# Patient Record
Sex: Female | Born: 1970 | Race: White | Hispanic: No | Marital: Married | Smoking: Never smoker
Health system: Southern US, Community
[De-identification: ages and names within clinical notes are randomized; demographics above are authoritative.]

## PROBLEM LIST (undated history)

## (undated) HISTORY — PX: TUBAL LIGATION: SHX77

## (undated) HISTORY — PX: HERNIA REPAIR: SHX51

---

## 1999-02-12 ENCOUNTER — Other Ambulatory Visit: Admission: RE | Admit: 1999-02-12 | Discharge: 1999-02-12 | Payer: Self-pay | Admitting: Obstetrics and Gynecology

## 1999-05-22 ENCOUNTER — Encounter (INDEPENDENT_AMBULATORY_CARE_PROVIDER_SITE_OTHER): Payer: Self-pay | Admitting: Specialist

## 1999-05-22 ENCOUNTER — Other Ambulatory Visit: Admission: RE | Admit: 1999-05-22 | Discharge: 1999-05-22 | Payer: Self-pay | Admitting: *Deleted

## 1999-09-29 ENCOUNTER — Other Ambulatory Visit: Admission: RE | Admit: 1999-09-29 | Discharge: 1999-09-29 | Payer: Self-pay | Admitting: *Deleted

## 2000-06-01 ENCOUNTER — Other Ambulatory Visit: Admission: RE | Admit: 2000-06-01 | Discharge: 2000-06-01 | Payer: Self-pay | Admitting: *Deleted

## 2001-07-13 ENCOUNTER — Other Ambulatory Visit: Admission: RE | Admit: 2001-07-13 | Discharge: 2001-07-13 | Payer: Self-pay | Admitting: Obstetrics and Gynecology

## 2001-11-02 ENCOUNTER — Ambulatory Visit (HOSPITAL_COMMUNITY): Admission: RE | Admit: 2001-11-02 | Discharge: 2001-11-02 | Payer: Self-pay | Admitting: Obstetrics and Gynecology

## 2001-11-02 ENCOUNTER — Encounter: Payer: Self-pay | Admitting: Obstetrics and Gynecology

## 2002-03-08 ENCOUNTER — Other Ambulatory Visit: Admission: RE | Admit: 2002-03-08 | Discharge: 2002-03-08 | Payer: Self-pay | Admitting: Obstetrics and Gynecology

## 2002-09-21 ENCOUNTER — Inpatient Hospital Stay (HOSPITAL_COMMUNITY): Admission: AD | Admit: 2002-09-21 | Discharge: 2002-09-26 | Payer: Self-pay | Admitting: Obstetrics and Gynecology

## 2002-09-23 ENCOUNTER — Encounter (INDEPENDENT_AMBULATORY_CARE_PROVIDER_SITE_OTHER): Payer: Self-pay

## 2002-10-20 ENCOUNTER — Other Ambulatory Visit: Admission: RE | Admit: 2002-10-20 | Discharge: 2002-10-20 | Payer: Self-pay | Admitting: Obstetrics and Gynecology

## 2003-08-14 ENCOUNTER — Ambulatory Visit (HOSPITAL_COMMUNITY): Admission: RE | Admit: 2003-08-14 | Discharge: 2003-08-14 | Payer: Self-pay | Admitting: Internal Medicine

## 2004-09-05 ENCOUNTER — Ambulatory Visit (HOSPITAL_COMMUNITY): Admission: RE | Admit: 2004-09-05 | Discharge: 2004-09-05 | Payer: Self-pay | Admitting: Obstetrics and Gynecology

## 2004-10-23 ENCOUNTER — Inpatient Hospital Stay (HOSPITAL_COMMUNITY): Admission: AD | Admit: 2004-10-23 | Discharge: 2004-10-23 | Payer: Self-pay | Admitting: Obstetrics and Gynecology

## 2004-11-23 ENCOUNTER — Inpatient Hospital Stay (HOSPITAL_COMMUNITY): Admission: RE | Admit: 2004-11-23 | Discharge: 2004-11-23 | Payer: Self-pay | Admitting: Obstetrics & Gynecology

## 2005-03-31 ENCOUNTER — Other Ambulatory Visit: Admission: RE | Admit: 2005-03-31 | Discharge: 2005-03-31 | Payer: Self-pay | Admitting: Gynecology

## 2005-04-07 ENCOUNTER — Ambulatory Visit: Payer: Self-pay | Admitting: *Deleted

## 2005-08-08 ENCOUNTER — Inpatient Hospital Stay (HOSPITAL_COMMUNITY): Admission: AD | Admit: 2005-08-08 | Discharge: 2005-08-08 | Payer: Self-pay | Admitting: Gynecology

## 2006-06-06 ENCOUNTER — Inpatient Hospital Stay (HOSPITAL_COMMUNITY): Admission: AD | Admit: 2006-06-06 | Discharge: 2006-06-06 | Payer: Self-pay | Admitting: Gynecology

## 2006-07-09 ENCOUNTER — Ambulatory Visit (HOSPITAL_COMMUNITY): Admission: RE | Admit: 2006-07-09 | Discharge: 2006-07-09 | Payer: Self-pay | Admitting: Obstetrics and Gynecology

## 2006-07-30 ENCOUNTER — Ambulatory Visit (HOSPITAL_COMMUNITY): Admission: RE | Admit: 2006-07-30 | Discharge: 2006-07-30 | Payer: Self-pay | Admitting: Obstetrics and Gynecology

## 2006-09-01 ENCOUNTER — Ambulatory Visit (HOSPITAL_COMMUNITY): Admission: RE | Admit: 2006-09-01 | Discharge: 2006-09-01 | Payer: Self-pay | Admitting: Obstetrics and Gynecology

## 2006-10-01 ENCOUNTER — Inpatient Hospital Stay (HOSPITAL_COMMUNITY): Admission: AD | Admit: 2006-10-01 | Discharge: 2006-10-01 | Payer: Self-pay | Admitting: Obstetrics and Gynecology

## 2006-10-02 ENCOUNTER — Inpatient Hospital Stay (HOSPITAL_COMMUNITY): Admission: AD | Admit: 2006-10-02 | Discharge: 2006-10-02 | Payer: Self-pay | Admitting: Obstetrics and Gynecology

## 2006-12-03 ENCOUNTER — Inpatient Hospital Stay (HOSPITAL_COMMUNITY): Admission: AD | Admit: 2006-12-03 | Discharge: 2006-12-03 | Payer: Self-pay | Admitting: Obstetrics and Gynecology

## 2007-01-13 ENCOUNTER — Inpatient Hospital Stay (HOSPITAL_COMMUNITY): Admission: RE | Admit: 2007-01-13 | Discharge: 2007-01-16 | Payer: Self-pay | Admitting: Obstetrics and Gynecology

## 2007-01-13 ENCOUNTER — Encounter (INDEPENDENT_AMBULATORY_CARE_PROVIDER_SITE_OTHER): Payer: Self-pay | Admitting: Obstetrics and Gynecology

## 2008-12-20 ENCOUNTER — Ambulatory Visit (HOSPITAL_COMMUNITY): Admission: RE | Admit: 2008-12-20 | Discharge: 2008-12-20 | Payer: Self-pay | Admitting: Obstetrics and Gynecology

## 2009-03-11 ENCOUNTER — Ambulatory Visit (HOSPITAL_COMMUNITY): Admission: RE | Admit: 2009-03-11 | Discharge: 2009-03-11 | Payer: Self-pay | Admitting: Obstetrics and Gynecology

## 2009-03-12 ENCOUNTER — Ambulatory Visit (HOSPITAL_COMMUNITY): Admission: RE | Admit: 2009-03-12 | Discharge: 2009-03-12 | Payer: Self-pay | Admitting: Obstetrics and Gynecology

## 2009-04-15 ENCOUNTER — Inpatient Hospital Stay (HOSPITAL_COMMUNITY): Admission: AD | Admit: 2009-04-15 | Discharge: 2009-04-26 | Payer: Self-pay | Admitting: Obstetrics and Gynecology

## 2009-04-23 ENCOUNTER — Encounter (INDEPENDENT_AMBULATORY_CARE_PROVIDER_SITE_OTHER): Payer: Self-pay | Admitting: Obstetrics and Gynecology

## 2010-07-06 LAB — CBC
HCT: 33.7 % — ABNORMAL LOW (ref 36.0–46.0)
Hemoglobin: 11.4 g/dL — ABNORMAL LOW (ref 12.0–15.0)
MCHC: 33.9 g/dL (ref 30.0–36.0)
MCHC: 34.6 g/dL (ref 30.0–36.0)
MCV: 96.9 fL (ref 78.0–100.0)
MCV: 98.9 fL (ref 78.0–100.0)
RBC: 3.33 MIL/uL — ABNORMAL LOW (ref 3.87–5.11)
RBC: 3.41 MIL/uL — ABNORMAL LOW (ref 3.87–5.11)
RDW: 13.7 % (ref 11.5–15.5)
RDW: 14.3 % (ref 11.5–15.5)
WBC: 14.4 10*3/uL — ABNORMAL HIGH (ref 4.0–10.5)
WBC: 19.1 10*3/uL — ABNORMAL HIGH (ref 4.0–10.5)

## 2010-07-21 LAB — CBC
HCT: 34 % — ABNORMAL LOW (ref 36.0–46.0)
Hemoglobin: 11.6 g/dL — ABNORMAL LOW (ref 12.0–15.0)
RDW: 13.1 % (ref 11.5–15.5)

## 2010-07-21 LAB — STREP B DNA PROBE: Strep Group B Ag: POSITIVE

## 2010-07-25 LAB — CBC
Hemoglobin: 12.2 g/dL (ref 12.0–15.0)
Platelets: 199 10*3/uL (ref 150–400)
RBC: 3.82 MIL/uL — ABNORMAL LOW (ref 3.87–5.11)

## 2010-09-02 NOTE — Op Note (Signed)
NAME:  [name], [name]             ACCOUNT NO.:  [id number]   MEDICAL RECORD NO.:  [id number]          PATIENT TYPE:  AMB   LOCATION:  SDC                           FACILITY:  [facility]   PHYSICIAN:  Huel Cote, M.D. DATE OF BIRTH:  11/14/1970   DATE OF PROCEDURE:  09/01/2006  DATE OF DISCHARGE:                               OPERATIVE REPORT   PREOPERATIVE DIAGNOSES:  1. Pregnancy at 19+ weeks.  2. Possible incompetent cervix.   POSTOPERATIVE DIAGNOSES:  1. Pregnancy at 19+ weeks.  2. Possible incompetent cervix.   PROCEDURE PERFORMED:  Cervical cerclage x2, McDonald style with knots at  1 o'clock.   ANESTHESIA:  Spinal.   ESTIMATED BLOOD LOSS:  100 mL.   URINE OUTPUT:  200 mL clear urine.  Fetal heart tones 140 prior to  procedure and 150 post procedure.   FINDINGS:  The cervix itself was noted to have a defect extending up as  far as the vaginal mucosa on the posterior fornix of the vagina at  approximately 1 o'clock.  It did feel that this possibly tracked up to  the internal os.  However, the internal os did not feel open.  The  cerclage itself was placed into pursestring fashions with the knots  overlying this defect.  Post procedure the defect had been minimized  although there was still a palpable small depression just at the 1  o'clock position.  However, it was much smaller and would not admit a  finger.   PROCEDURE:  The patient was taken to the operating room where spinal  anesthesia was obtained without difficulty.  She was then prepped and  draped in a normal sterile fashion in the dorsal lithotomy position.  A  weighted speculum was placed within the vagina and Sims retractor was  utilized to visualize the cervix entirely.  As had been palpated in the  office, there was a defect noted at the approximately 1 to 2 o'clock  position with the rest of the cervix appearing intact, somewhat in a C-  shaped fashion.  At this defect, you could admit a finger and  it  traveled up towards the internal os.  However, the internal os itself  could not be felt to be opened.   At this point, the [facility] suture was utilized to make a pursestring  suture which bridged this defect and with the whole circumference of the  cervix, this was tightened down and everything palpated once more.  This  defect still remained, so one additional pursestring was placed just  behind the prior one and did improve the feeling of the defect to a  smaller size.  It was impossible to completely close the defect given  that it tracked well beyond the vaginal mucosa and it was felt that the  maximum benefit had been achieved with a second cerclage.  At this  point, there was a little bit of extra bleeding at the area where the  mucosa was denuded and this was closed with a 3-0 Vicryl on an SH  needle, mostly just to minimize bleeding for patient reassurance and  there was minimal bleeding at the conclusion of the procedure.   All instruments and sponges were then removed from the vagina which was  once again swabbed with Betadine and the patient was taken to the  recovery room in stable condition.  There was no leakage of fluid noted  and the fetal heart tones were stable both prior to and after procedure.      Huel Cote, M.D.  Electronically Signed     KR/MEDQ  D:  09/01/2006  T:  09/01/2006  Job:  [id number]

## 2010-09-02 NOTE — Op Note (Signed)
NAMEVIVAN, VANDERVEER             ACCOUNT NO.:  [id number]   MEDICAL RECORD NO.:  [id number]          PATIENT TYPE:  INP   LOCATION:  9131                          [facility]ACILITY:  [facility]   PHYSICIAN:  Huel Cote, M.D. DATE OF BIRTH:  04/04/1971   DATE OF PROCEDURE:  01/13/2007  DATE OF DISCHARGE:  12/03/2006                               OPERATIVE REPORT   PREOPERATIVE DIAGNOSIS:  1. Term pregnancy at 39 weeks, delivered.  2. Previous cesarean section, declines vaginal birth after cesarean      section.  3. Incompetent cervix status post cerclage.   POSTOPERATIVE DIAGNOSIS:  1. Term pregnancy at 39 weeks, delivered.  2. Previous cesarean section, declines vaginal birth after cesarean      section.  3. Incompetent cervix status post cerclage.   PROCEDURE:  Repeat low transverse cesarean section.   SURGEON:  Huel Cote, M.D.   ASSISTANT:  Sherron Monday, M.D.   ANESTHESIA:  Spinal.   FINDINGS:  There is a viable female infant in the vertex presentation,  Apgars were 8/9, weight was 7 pounds 14 ounces.  The uterus, ovaries and  tubes were normal appearing.  There was no visible anterior defect in  the lower uterine segment. Palpation of the cervical edge through the  uterine scar did reveal that the left fornix appeared to have a defect  in the cervix, itself, with some scarring. The placenta was removed and  was felt to have a succenturiate lobe, so was sent to pathology.   ESTIMATED BLOOD LOSS:  900 mL.   URINE OUTPUT:  200 mL clear urine.   FLUIDS:  2200 mL LR.   DESCRIPTION OF PROCEDURE:  The patient was taken to the operating room  where a spinal anesthesia was obtained without difficulty.  She was then  prepped and draped in a normal sterile fashion in the dorsal supine  position with a leftward tilt.  With a Foley catheter in place, a  Pfannenstiel skin incision was then made through a pre-existing scar and  carried through to the underlying layer of fascia by  sharp dissection  and Bovie cautery.  The fascia was opened in the midline and the  incision was extended laterally with Mayo scissors.  The inferior aspect  of the incision was elevated, grasped, and dissected off the underlying  rectus muscles with Bovie cautery. In a similar fashion, the superior  aspect was dissected off the rectus muscles.  The rectus muscles were  separated in the midline and the peritoneal cavity entered bluntly.  The  peritoneal incision was extended both superiorly and inferiorly with  careful attention to avoid both bowel and bladder.  The bladder flap was  created after the Alexis large self-retaining wound retractor was placed  within the incision and the lower uterine segment was exposed nicely.   The scalpel was then utilized to make a transverse incision in the lower  uterine segment.  The cavity, itself, was entered bluntly and the  uterine incision was extended bluntly.  The fluid was noted to be clear  on rupture of membranes and the infant's head was delivered  atraumatically and bulb suctioned.  The remainder of the infant's body  delivered without difficulty.  The infant's head was delivered with a  Kiwi vacuum assist as fundal pressure was not delivering the head, so  gentle traction with the Kiwi vacuum was utilized to help deliver the  head.  After the infant was delivered, the cord was clamped and cut. The  infant was handed to awaiting pediatricians and the placenta was  delivered manually due to some brisk bleeding and handed off for cord  blood collection.   The uterus was cleared of all clots and debris with a moist lap sponge.  The uterine incision was then repaired in two layers, the first a  running locked layer of 0 chromic and the second an imbricating layer of  the same suture.  Before complete closure of the uterine incision, there  was a brief inspection of the lower uterine segment digitally and the  anterior cervical edge was just  palpable approximately 3 cm below the  incision.  It was able to be identified over until reaching the left  fornix and, at this point, there was some scar tissue but no palpable  cervix was felt.  The remainder of the uterine incision was closed and  good hemostasis was noted.  There was one small area on the midline  which was controlled with a suture of 3-0 Vicryl in a figure-of-eight  fashion.  The abdomen and pelvis were irrigated and no active bleeding  was noted.  The bladder appeared away from the uterine incision and the  urine was noted to be clear.   At this point, no active bleeding was noted subfascially, therefore the  fascia was closed with 0 Vicryl in a running fashion.  The subcutaneous  tissue was reapproximated with 2-0 plain in a subcutaneous running  stitch and the skin was closed with staples.  Sponge, lap, and needle  counts were correct x2 and the patient was taken to the recovery room in  stable condition.      Huel Cote, M.D.  Electronically Signed     KR/MEDQ  D:  01/13/2007  T:  01/13/2007  Job:  [id number]

## 2010-09-02 NOTE — H&P (Signed)
NAME:  [name], [name]             ACCOUNT NO.:  [id number]   MEDICAL RECORD NO.:  [id number]          PATIENT TYPE:  AMB   LOCATION:  SDC                           [facility]   PHYSICIAN:  Huel Cote, M.D. DATE OF BIRTH:  Oct 23, 1970   DATE OF ADMISSION:  DATE OF DISCHARGE:                              HISTORY & PHYSICAL   The patient is a 40 year old G4, P2-0-1-2, who is coming in for a  scheduled cerclage placement at approximately 14-1/2 weeks' gestation  given a finding of a prior incompetent cervix with her last pregnancy.  Her estimated due date is June 15, 2009, by an 8-week ultrasound.  She did have episodes of first trimester bleeding related to a  subchorionic hemorrhage which have resolved and otherwise has had normal  care.  She had a normal first trimester screen.  Her blood type is O+,  and all other prenatal labs were normal.   PAST OBSTETRICAL HISTORY:  Significant for a prior C-section x2.  In  2004 she had a C-section at 41 weeks of a 7 pound 8 ounce infant.  She  had a spontaneous miscarriage in 2006, and in 2008 she had a C-section  at 39 weeks of a 7 pound 14 ounce infant, the result of an IVF  pregnancy.  This pregnancy also had cerclage placement due to findings  of an incompetent cervix noted on ultrasound and physical exam.   PAST GYNECOLOGIC HISTORY:  Remote history of abnormal Pap smear and  history of infertility; however, this pregnancy was spontaneously  conceived.   PAST MEDICAL HISTORY:  None.   PAST SURGICAL HISTORY:  In 1991 she had a left inguinal hernia repair,  in 1993 repair of a ruptured ovarian cyst, in 2004 C-section and 2008  the cerclage and a repeat C-section.   She has no known drug allergies.   PHYSICAL EXAMINATION:  Weight is 160 pounds, blood pressure 100/70.  CARDIAC EXAM:  Regular rate and rhythm.  LUNGS:  Clear.  ABDOMEN:  Soft, gravid and nontender.  Fundal height is consistent with  a 14-15-week gestation.  PELVIC EXAM:  The cervix is long and closed.  However, there is a  palpable defect on the cervix in the anterior superficial surface to the  patient's left.   The patient was counseled as to the risks and benefits of cerclage  placement including bleeding, infection and possible damage to bowel and  bladder, and she understands all these risks and desires to proceed as  stated.  We will  proceed with cerclage placement and the patient knows she will likely  have to have bedrest as well, as she did last pregnancy, but we will  assess this on an ongoing basis.  She is aware of the risk of rupture of  membranes with cerclage placement and possible fetal loss should this  occur.      Huel Cote, M.D.  Electronically Signed     KR/MEDQ  D:  12/19/2008  T:  12/19/2008  Job:  [id number]

## 2010-09-02 NOTE — H&P (Signed)
NAME:  [name], [name]             ACCOUNT NO.:  [id number]   MEDICAL RECORD NO.:  [id number]          PATIENT TYPE:  INP   LOCATION:  NA                            [facility]   PHYSICIAN:  Huel Cote, M.D. DATE OF BIRTH:  16-Feb-1971   DATE OF ADMISSION:  DATE OF DISCHARGE:                              HISTORY & PHYSICAL   The patient is a 40 year old G3, P1-0-1-1 who is scheduled at [redacted] weeks  gestation for a repeat low transverse C-section and declines trial of  VBAC.  The patient had a somewhat complicated prenatal course with a  probable incompetent cervix which was treated with a cervical cerclage  placed at approximately 18-[redacted] weeks gestation.  The patient had some  cervical damage from a prior delivery and had a palpable cervical defect  which was noted early on at approximately 12 o'clock to 1 o'clock on her  physical exam.  She had the cervical cerclage placed and around [redacted] weeks  gestation was noted to be approximately a fingertip dilated.  She did  receive steroids x2 at that point and remained on bedrest the entire  pregnancy.  At approximately 32 weeks her cervix was noted to dilate  further to approximately 2 cm.  However, she was maintained on bedrest  and though she continued to dilate, reaching approximately 4-5 cm at her  last check at 38 weeks, she never went into spontaneous labor and now is  scheduled to undergo a repeat C-section.  We discussed a trial of labor.  However, due to the difficulty of her last delivery and this baby being  at the large end of normal, she declines any trial of labor at this  time.   PRENATAL LABORATORIES:  O positive, antibody negative.  RPR nonreactive.  Rubella immune.  Hepatitis B surface antigen negative.  HIV negative.  GC negative, chlamydia negative.  Group B strep positive on a urine  culture.   PAST OBSTETRICAL HISTORY:  As stated, the patient had a C-section in  2004 following an arrest of descent.  The infant was  7 pounds 8 ounces  and she had a spontaneous miscarriage in 2006.  This pregnancy was in  vitro fertilization.   PAST GYNECOLOGICAL HISTORY:  The patient has no abnormal Pap smears.  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  However, this was unsuccessful and lead to  her one miscarriage and she had in vitro fertilization to conceive this  pregnancy.   MEDICATIONS:  Prenatal vitamins and folic acid.   ALLERGIES:  None.   PAST SURGICAL HISTORY:  She had the C-section as described previously,  inguinal hernia repair, and a laparotomy with an ovarian cystectomy in  the past.   PHYSICAL EXAMINATION:  Her weight is 201 pounds, blood pressure 112/70.  CARDIAC:  Regular rate and rhythm.  LUNGS:  Clear.  ABDOMEN:  Gravid and nontender with an estimated fetal weight of 8 to 8-  and-a-half pounds.  The cervix was 70, 4-5, and a -1 station.   The patient again was counseled as to the  risks and benefits of  proceeding with another surgery versus a trial of labor.  Had she gone  into labor prior to her C-section date she was considering VBAC.  However, given that she has remained 4-5 cm dilated for several weeks  without spontaneous labor she now declines the option of VBAC and wishes  instead to proceed with a repeat C-section.  She will undergo a repeat C-  section and we discussed that her abdominal incision is slightly low,  near the pubic bone, and we may or may not be able to use this, and she  may need a new incision above that one.  She understands the risks of  bleeding, infection, and possible damage to bowel and bladder, as well  as the recovery period should any complication arise.  We will try to  evaluate whether her cervical defect extends up into the lower uterine  segment at the time of C-section, but it may not be visible at the time  of surgery.      Huel Cote, M.D.  Electronically Signed      KR/MEDQ  D:  01/12/2007  T:  01/12/2007  Job:  [id number]

## 2010-09-02 NOTE — Op Note (Signed)
NAME:  [name], [name]             ACCOUNT NO.:  [id number]   MEDICAL RECORD NO.:  [id number]          PATIENT TYPE:  AMB   LOCATION:  SDC                           [facility]   PHYSICIAN:  Huel Cote, M.D. DATE OF BIRTH:  01/04/1971   DATE OF PROCEDURE:  12/20/2008  DATE OF DISCHARGE:                               OPERATIVE REPORT   PREOPERATIVE DIAGNOSES:  1. Incompetent cervix and cervical defect from past trauma.  2. Pregnancy at approximately 15 weeks' gestation.   POSTOPERATIVE DIAGNOSES:  1. Incompetent cervix and cervical defect from past trauma.  2. Pregnancy at approximately 15 weeks' gestation.   PROCEDURE:  Cervical cerclage x2 McDonald and a repair of the cervical  defect.   SURGEON:  Huel Cote, MD   ANESTHESIA:  Spinal.   FINDINGS:  The cervix had approximately a good 1-2 cm of length noted  from the vaginal wall.  There was obvious defect from 1 to 2 o'clock on  the cervix, which had been previously documented in the patient's prior  pregnancy and seemed to track up into the lower uterine segment.  There  was no back or parts palpable at the time, so it was felt as of the  internal cervical os was closed, however; this tissue was friable and  exposed.   ESTIMATED BLOOD LOSS FOR THE PROCEDURE:  100 mL.   URINE OUTPUT:  30 mL of concentrated urine.   IV FLUIDS:  2200 of LR.   COMPLICATIONS:  No known complications.   FETAL HEART TONES:  Will be documented in the recovery room and were  documented prior to surgery.   PROCEDURE NOTE:  The patient was taken to operating room where spinal  anesthesia was obtained without difficulty.  She was then prepped and  draped in normal sterile fashion in dorsal lithotomy position.  The  bladder was drained of small amount of concentrated urine, and on exam,  the cervix was as noted with some good length available.  However, a  large defect from approximately 1 to 2 o'clock on the cervix tracking up  to at least see internal cervical os.  This area was friable and exuding  some mucus.  The cervical cerclage was then placed x2 with the McDonald  cerclage and this was carried across the gap in the cervical mucosa to  breech that area with cervical tissue and pulled together cervical  stroma.  At the conclusion of the procedure, both cerclages were placed,  one knot was at approximately 11 o'clock and the other knot at 1  o'clock.  The cervix itself did feel closed.  There was still some  visible anterior defect behind the cerclage after placement; however,  this seemed somewhat more superficial in nature, and after  consideration, it was closed with interrupted sutures of 0 Prolene to  help protect from any infection in that area from tracking up through  the exposed mucosa.  Once this was closed, there was no active bleeding  and the defect appeared closed.  The patient will be followed by vaginal  exams and serial ultrasounds as she was  last pregnancy and hopes that  this defect will not allow preterm birth.  The sponge, lap, and needle  counts were correct x2.  Again, there was no active bleeding and the  patient was taken to the recovery room in good condition.      Huel Cote, M.D.  Electronically Signed     KR/MEDQ  D:  12/20/2008  T:  12/20/2008  Job:  [id number]

## 2010-09-02 NOTE — H&P (Signed)
NAME:  [name], [name]             ACCOUNT NO.:  [id number]   MEDICAL RECORD NO.:  [id number]          PATIENT TYPE:  AMB   LOCATION:  SDC                           [facility]   PHYSICIAN:  Huel Cote, M.D. DATE OF BIRTH:  1970/12/21   DATE OF ADMISSION:  DATE OF DISCHARGE:                              HISTORY & PHYSICAL   REASON FOR ADMISSION:  Surgery is to take place at the [facility] at 8:30 a.m. on October 02, 2006.   HISTORY OF PRESENT ILLNESS:  The patient is a 40 year old G3, P1-0-1-1,  who came in for a scheduled cervical cerclage given the possibility of  incompetent cervix noted on her exam.  The patient has an interesting  history of a cervical fragment being removed after C-section which  revealed some stromal muscle, and this was somewhat infarcted at the  time of removal.  Since then on sequential cervical checks, the patient  has a normal cervical length; however, there is a defect noted in the  anterior portion of the cervix, which tracks down to approximately the  internal cervical os.  The patient is currently approximately [redacted] weeks  pregnant with a due date of January 20, 2007.  This is an in-vitro  fertilization pregnancy and other than that, the pregnancy has  progressed normally.   PRENATAL LABORATORY DATA:  O positive, antibody negative.  RPR  nonreactive.  Rubella immune.  Hepatitis B surface antigen negative.  HIV negative.  GC negative.  Chlamydia negative.  Group B strep positive  in her urine.   PAST OBSTETRICAL HISTORY:  In 2004 the patient had a C-section following  an arrest of descent.  Infant was 7 pounds 8 ounces.  She also had a  spontaneous miscarriage in 2006 and is currently pregnant at this time  with the IVF pregnancy.   The patient has a gynecological significant for abnormal Pap smear years  ago, which has since normalized, and, as stated a previous a removal of  a portion of her cervix at a postpartum visit with  some infarcted tissue  noted.   PAST SURGICAL HISTORY:  In 1991 she had a left inguinal hernia repair.  In 1993 she had a laparoscopy with repair of a left ovarian ruptured  cyst.  In 2004, her C-section.   PAST MEDICAL HISTORY:  None.   HISTORY OF ALLERGIES:  None.   CURRENT MEDICATIONS:  None but prenatal vitamins and folic acid.   PHYSICAL EXAMINATION:  VITAL SIGNS:  The patient's weight is 170 pounds.  Blood pressure 100/60.  CARDIAC:  Regular rate and rhythm.  LUNGS:  Clear.  ABDOMEN:  Gravid and nontender with a fundal height appropriate to the  umbilicus.  Positive fetal heart tones are noted.  PELVIC:  The patient has a normal cervical length; however, there is a  defect noted anteriorly, which tracks down to approximately the internal  cervical os and this feels that it could admit a small fingertip.   All options were discussed with the patient and husband, and it was made  clear that it was not obvious whether  a cervical cerclage would  completely benefit her situation or not; however, given the defect of  the cervix noted anteriorly and the possibility that the internal  cervical os was open slightly, it was felt that cervical cerclage might  could benefit her and decrease the chances she would need bed rest and  decrease her risk of preterm delivery.  We discussed all the risks and  benefits of the cerclage, including bleeding and damage to the bag of  water, which could result in loss of pregnancy, and the patient  understands these risks, however decides that she would like to proceed  with surgery.  We will proceed with surgery at the [facility]  facility on the 14th, and the patient understands all risks and benefits  and desires to proceed.      Huel Cote, M.D.  Electronically Signed     KR/MEDQ  D:  08/31/2006  T:  08/31/2006  Job:  [id number]

## 2010-09-05 NOTE — Op Note (Signed)
NAME:  [name], [name]                         ACCOUNT NO.:  [id number]   MEDICAL RECORD NO.:  [id number]                   PATIENT TYPE:  INP   LOCATION:  9303                                 FACILITY:  [facility]   PHYSICIAN:  Maxie Better, M.D.            DATE OF BIRTH:  04-22-1970   DATE OF PROCEDURE:  09/23/2002  DATE OF DISCHARGE:                                 OPERATIVE REPORT   PREOPERATIVE DIAGNOSIS:  Arrest of descent.   PROCEDURE:  Primary cesarean section.   POSTOPERATIVE DIAGNOSIS:  Arrest of descent.   ANESTHESIA:  Epidural.   SURGEON:  Maxie Better, M.D.   INDICATIONS:  This is a 40 year old gravida 1, para 0 female at term,  admitted on 09/21/02, for induction of labor secondary to question of large  for gestational age baby.  The patient underwent cervical ripening with  Cervidil, followed by Pitocin.  She subsequently had artificial rupture of  membranes at 1 cm dilatation.  The patient quickly progressed to 10 cm.  She  started pushing, however on exam was noted to have posterior presentation;  therefore, the patient was placed in an exaggerated Sims position and rested  fo ran hour.  She resumed pushing and despite different positional changes  with the pushing, the patient had no further descent beyond +1 station.  She  requested a cesarean section.  The baby had remained in the direct occiput  posterior presentation.  During her labor she developed a temperature of  100.5.  At the time the patient was fully dilated.  She, however, was  started on Unasyn for possible chorioamnionitis, although she had only had  rupture of membranes for seven hours.  Risks and benefits of the cesarean  section were reviewed with the patient and her family.  The patient had  consented for possible cesarean section.  She was transferred to the  operating room.   PROCEDURE:  Under adequate epidural anesthesia, the patient was placed in  the supine position with a left  lateral tilt.  She was thoroughly prepped  and draped in the usual fashion.  An indwelling Foley catheter had been in  place prior to transfer to the operating room.  The patient had a previous  Pfannenstiel skin incision due to ruptured ovary in the past.  Marcaine  0.25% 10 mL was injected along the previous scar.  A Pfannenstiel skin  incision was made through the previous scar and extended to the rectus  fascia using Bovie cautery.  The rectus fascia was incised in the midline  and extended bilaterally.  The rectus fascia was then bluntly and sharply  dissected off the rectus muscles in a superior and inferior fashion.  The  rectus muscle was split in the midline, the parietal peritoneum was entered  sharply and extended superiorly inferiorly.  Serous fluid was noted in the  abdomen.  The vesicouterine peritoneum was then opened and extended  transversely.  The bladder was carefully dissected off the lower uterine  segment and displaced inferiorly using a bladder retractor.  A curvilinear  low transverse uterine incision was then made and extended bilaterally using  bandage scissors.  Delivery of a live female from the occiput posterior  presentation was accomplished.  The baby was noted to be hyperextended.  The  baby was bulb-suctioned on the abdomen.  The cord was clamped, cut.  The  baby was transferred to the awaiting pediatrician, who assigned Apgars of 9  and 9.  The placenta was spontaneous intact, sent to pathology due to prior  questions during her pregnancy.  Uterine cavity was cleaned of debris.  There was no uterine anomaly noted.  Uterine incision was closed in two  layers.  The first layer was a 0 Monocryl running locked stitch, second  layer was imbricating using 0 Monocryl suture.  Small bleeders were  cauterized.  Normal tubes and ovaries were noted bilaterally.  The abdomen  was copiously irrigated and suctioned of debris.  Reinspection of the  uterine incision showed  a small area of bleeding on the left, which was  hemostased using a figure-of-eight suture.  With good hemostasis  subsequently noted, the vesicouterine peritoneum and the parietal peritoneum  were now closed.  The rectus fascia was inspected and no defects noted.  The  rectus muscle was inspected and no bleeding seen.  The rectus fascia was  closed with 0 Vicryl x2.  The skin was approximated with Ethicon staples  after the subcutaneous area was irrigated and suctioned of debris.   SPECIMENS:  Placenta was sent to pathology.   ESTIMATED BLOOD LOSS:  600 mL.   INTRAOPERATIVE FLUID:  900 crystalloid.   URINE OUTPUT:  250 mL clear yellow urine.   Sponge and instrument counts x2 were correct.   COMPLICATIONS:  None.   The patient tolerated the procedure well, was transferred to the recovery  room in stable condition.                                                Maxie Better, M.D.    [place]/MEDQ  D:  09/23/2002  T:  09/24/2002  Job:  [id number]

## 2010-09-05 NOTE — Discharge Summary (Signed)
NAME:  [name], [name]                         ACCOUNT NO.:  [id number]   MEDICAL RECORD NO.:  [id number]                   PATIENT TYPE:  INP   LOCATION:  9303                                 FACILITY:  [facility]   PHYSICIAN:  Maxie Better, M.D.            DATE OF BIRTH:  02-11-1971   DATE OF ADMISSION:  09/21/2002  DATE OF DISCHARGE:  09/26/2002                                 DISCHARGE SUMMARY   ADMISSION DIAGNOSIS:  Suspect large for gestational age baby, term  gestation.   DISCHARGE DIAGNOSES:  1. Term gestation, delivered.  2. Mild acute chorioamnionitis.  3. Arrest of descent.   PROCEDURE:  Primary cesarean section.   HISTORY OF PRESENT ILLNESS:  [age]-year-old gravida 1, para 0 female at  term, admitted for induction of labor secondary to possibly large for  gestational age baby.  Her prenatal course had been complicated by serial  ultrasounds due to an abnormal finding of hypoechoic band adhered to the  placenta.   HOSPITAL COURSE:  The patient was admitted.  She underwent cervical ripening  with Cervidil followed by Pitocin.  She subsequently had artificial rupture  of membranes at 1 cm dilatation.  The patient progressed quickly to 10 cm.  She had vertex +1 station with a suggestion of a posterior presentation.  During the course of labor, the patient developed chorioamnionitis thought  due to temperature of 100.5.  Unasyn was started.  The patient pushed,  however did not change fetal descent and therefore she was transferred to  the operating room for primary cesarean section.  The patient underwent a  primary cesarean section that resulted in delivery of a live female from the  occiput posterior presentation with the head hyperextended, Apgars of 9 and  9.  Weight is 7 pounds, 8 ounces.  Postoperatively, the patient was  continued with antibiotics until afebrile for 24 to 48 hours.  CBC on postop  day number one showed a white count of 18.9, hemoglobin 11.6,  hematocrit  33.6.  She had a maximum temperature postop of 100.7.  The patient  subsequently defervesced.  She had a bowel movement by postop day number  two.  By postoperative day number three, she was tolerating a regular diet.  Incision without any erythema, induration, or exudate.  She has moderate  lochia.  She was deemed well to be discharged home.   DISPOSITION:  Home.   CONDITION:  Stable.   DISCHARGE FOLLOWUP:  Four weeks postpartum.   DISCHARGE MEDICATIONS:  1. Motrin 600 mg, number 30, one p.o. q.6h p.r.n. pain.  2. Tylox one to two tablets every three to four hours p.r.n. pain.  3. Prenatal vitamins one p.o. q.d.    DISCHARGE INSTRUCTIONS:  Call for temperature greater than or equal to  100.4.  Nothing per vagina for four to six weeks.  No heavy lifting or  driving for two weeks.  Call if increased incisional pain,  redness, or  drainage from the incision site, severe abdominal pain, nausea, and  vomiting.                                               Maxie Better, M.D.    [place]/MEDQ  D:  10/30/2002  T:  10/31/2002  Job:  [id number]

## 2010-09-05 NOTE — Discharge Summary (Signed)
[name], LANGER             ACCOUNT NO.:  [id number]   MEDICAL RECORD NO.:  [id number]          PATIENT TYPE:  INP   LOCATION:  9131                          FACILITY:  [facility]   PHYSICIAN:  Huel Cote, M.D. DATE OF BIRTH:  1970-08-18   DATE OF ADMISSION:  01/13/2007  DATE OF DISCHARGE:  01/16/2007                               DISCHARGE SUMMARY   DISCHARGE DIAGNOSES:  1. Previous cesarean section, declined vaginal birth after cesarean.  2. Term pregnancy at 39 weeks, delivered.  3. Incompetent cervix, status post cerclage and removal.  4. Status post repeat low transverse cesarean section.   DISCHARGE MEDICATIONS:  1. Motrin 600 mg p.o. every 6 hours.  2. Percocet 1-2 tablets p.o. every 4 hours p.r.n.   DISCHARGE FOLLOWUP:  The patient is to follow up in the office in 2 days  for staple removal.   HOSPITAL COURSE:  The patient briefly is a 40 year old G3, P1-0-1-1 who  came in for a scheduled repeat C-section at 32 weeks' gestation.  She  had a prenatal course complicated by an incompetent cervix, which was  treated with a cerclage and did make it to term.  She had her cerclages  removed at 36-plus weeks, however, did dilate at approximately 30 weeks  and was on bedrest throughout her pregnancy.  Her complete prenatal  history has been previously dictated in her history and physical prior  to her C-section.  Please see that for further details.  She underwent a  repeat low transverse C-section on January 13, 2007 and was delivered  of a vigorous female infant, Apgars were 8 and 9.  Weight was 7 pounds,  14 ounces.  There was normal uterus, ovaries and tubes noted.  The  cervix did not have a visible anterior defect on the superficial surface  of the uterus; however, with palpation inside the uterus you could feel  a defect to the left fornix on the cervix. Specimens sent included the  placenta because of __________ .  She was then admitted for routine  postoperative  care and did well.  On postop day #1 she was ambulating  and tolerating a regular diet.  Her pain was well controlled and she was  working on breast-feeding.  By postop day #3 she was tolerating a  regular diet, having normal bowel movements.  Her incision was clear and  staples were left in place, given some tension on the preexisting scar  to be removed in the office.  Her discharge hemoglobin was 11.4.  She  was discharged home with prescriptions as stated and will follow up in  the office in 2 to 3 days for staple removal.      Huel Cote, M.D.  Electronically Signed     [facility]/MEDQ  D:  03/02/2007  T:  03/02/2007  Job:  [id number]

## 2011-01-29 LAB — TYPE AND SCREEN: ABO/RH(D): O POS

## 2011-01-29 LAB — CBC
HCT: 32.6 — ABNORMAL LOW
HCT: 36.7
Hemoglobin: 13.1
MCHC: 35
MCHC: 35.5
RBC: 3.85 — ABNORMAL LOW
RDW: 14.1 — ABNORMAL HIGH
WBC: 8.8

## 2012-07-21 ENCOUNTER — Other Ambulatory Visit: Payer: Self-pay | Admitting: Dermatology

## 2013-05-01 ENCOUNTER — Other Ambulatory Visit: Payer: Self-pay

## 2013-05-01 DIAGNOSIS — Z1231 Encounter for screening mammogram for malignant neoplasm of breast: Secondary | ICD-10-CM

## 2013-05-23 ENCOUNTER — Ambulatory Visit
Admission: RE | Admit: 2013-05-23 | Discharge: 2013-05-23 | Disposition: A | Discharge status: Home / Self Care | Payer: BC Managed Care – PPO | Source: Ambulatory Visit

## 2013-05-23 DIAGNOSIS — Z1231 Encounter for screening mammogram for malignant neoplasm of breast: Secondary | ICD-10-CM

## 2013-08-10 ENCOUNTER — Other Ambulatory Visit: Payer: Self-pay | Admitting: Dermatology

## 2014-10-06 ENCOUNTER — Ambulatory Visit (INDEPENDENT_AMBULATORY_CARE_PROVIDER_SITE_OTHER): Payer: BC Managed Care – PPO | Admitting: Family Medicine

## 2014-10-06 ENCOUNTER — Ambulatory Visit (INDEPENDENT_AMBULATORY_CARE_PROVIDER_SITE_OTHER): Payer: BC Managed Care – PPO

## 2014-10-06 VITALS — BP 120/64 | HR 58 | Temp 97.5°F | Resp 16 | Ht 69.0 in | Wt 172.0 lb

## 2014-10-06 DIAGNOSIS — M79672 Pain in left foot: Secondary | ICD-10-CM | POA: Diagnosis not present

## 2014-10-06 DIAGNOSIS — M25572 Pain in left ankle and joints of left foot: Secondary | ICD-10-CM | POA: Diagnosis not present

## 2014-10-06 DIAGNOSIS — T148XXA Other injury of unspecified body region, initial encounter: Secondary | ICD-10-CM

## 2014-10-06 NOTE — Progress Notes (Signed)
Chief Complaint:  Chief Complaint  Patient presents with  . Foot Injury    Today while watering horse/ jumped off fence into hole    HPI: [name] is a 44 y.o. female who is here for left and ankle pain after she fell into a small hole when she jumped off a rail last night at the barn. She inverted her ankle. She continued to work and thought it was a sprain. She was waring tennis shoes. No prior hx of broken bone osteoporosis, No n.w/t. It just has a constant ache Can;t walk on it. Significant sharp dull throbbing pain depending on ROM. She rescues horse.   History reviewed. No pertinent past medical history. Past Surgical History  Procedure Laterality Date  . Cesarean section    . Hernia repair    . Tubal ligation     History   Social History  . Marital Status: Married    Spouse Name: N/A  . Number of Children: N/A  . Years of Education: N/A   Social History Main Topics  . Smoking status: Never Smoker   . Smokeless tobacco: Not on file  . Alcohol Use: No  . Drug Use: No  . Sexual Activity: Not on file   Other Topics Concern  . None   Social History Narrative  . None   Family History  Problem Relation Age of Onset  . Hyperlipidemia Mother   . Hyperlipidemia Father   . Heart disease Father   . Stroke Father   . Cancer Maternal Grandmother   . Cancer Maternal Grandfather   . Stroke Maternal Grandfather   . Cancer Paternal Grandmother   . Heart disease Paternal Grandfather    No Known Allergies Prior to Admission medications   Not on File     ROS: The patient denies fevers, chills, night sweats, unintentional weight loss, chest pain, palpitations, wheezing, dyspnea on exertion, nausea, vomiting, abdominal pain, dysuria, hematuria, melena, numbness, weakness, or tingling.   All other systems have been reviewed and were otherwise negative with the exception of those mentioned in the HPI and as above.    PHYSICAL EXAM: Filed Vitals:   10/06/14 1416  BP: 120/64  Pulse: 58  Temp: 97.5 F (36.4 C)  Resp: 16   Filed Vitals:   10/06/14 1416  Height:  (1.753 m)  Weight: 172 lb (78.019 kg)   Body mass index is 25.39 kg/(m^2).   General: Alert, no acute distress HEENT:  Normocephalic, atraumatic, oropharynx patent. EOMI, PERRLA Cardiovascular:  Regular rate and rhythm, no rubs murmurs or gallops.  Radial pulse intact. No pedal edema.  Respiratory: Clear to auscultation bilaterally.  No wheezes, rales, or rhonchi.  No cyanosis, no use of accessory musculature GI: No organomegaly, abdomen is soft and non-tender, positive bowel sounds.  No masses. Skin: No rashes. Neurologic: Facial musculature symmetric. Psychiatric: Patient is appropriate throughout our interaction. Lymphatic: No cervical lymphadenopathy Musculoskeletal: Gait nonweightbearing, in wheelcahir + swelling and tenderness, no open wounds. Left ankle and left foot tenderness + brisk tib and dp pulse Decrease ROm due to pain nontender knee, tibfib   LABS: Results for orders placed or performed during the hospital encounter of 04/15/09  CBC  Result Value Ref Range   WBC 14.4 (H) 4.0 - 10.5 K/uL   RBC 3.33 (L) 3.87 - 5.11 MIL/uL   Hemoglobin 11.2 (L) 12.0 - 15.0 g/dL   HCT 16.1 (L) 09.6 - 04.5 %   MCV 96.9 78.0 -  100.0 fL   MCHC 34.6 30.0 - 36.0 g/dL   RDW 62.5 63.8 - 93.7 %   Platelets 230 150 - 400 K/uL  CBC  Result Value Ref Range   WBC 19.1 (H) 4.0 - 10.5 K/uL   RBC 3.41 (L) 3.87 - 5.11 MIL/uL   Hemoglobin 11.4 (L) 12.0 - 15.0 g/dL   HCT 34.2 (L) 87.6 - 81.1 %   MCV 98.9 78.0 - 100.0 fL   MCHC 33.9 30.0 - 36.0 g/dL   RDW 57.2 62.0 - 35.5 %   Platelets 229 150 - 400 K/uL  Strep B DNA probe  Result Value Ref Range   Specimen Description VAGINAL/RECTAL    Special Requests NONE    Strep Group B Ag POSITIVE    Report Status 04/17/2009 FINAL   CBC  Result Value Ref Range   WBC 17.9 (H) 4.0 - 10.5 K/uL   RBC 3.51 (L) 3.87 - 5.11  MIL/uL   Hemoglobin 11.6 (L) 12.0 - 15.0 g/dL   HCT 97.4 (L) 16.3 - 84.5 %   MCV 96.8 78.0 - 100.0 fL   MCHC 34.3 30.0 - 36.0 g/dL   RDW 36.4 68.0 - 32.1 %   Platelets 234 150 - 400 K/uL  RPR  Result Value Ref Range   RPR Ser Ql NON REACTIVE NON REACTIVE     EKG/XRAY:   Primary read interpreted by Dr. Conley Rolls at [facility]. ? Joint space signal abnormality navicular Left foot  Please comment if there is a an avulsion fracture s/p fall/ankle sprain along cuboid No obviouss ankle fracture   ASSESSMENT/PLAN: Encounter Diagnoses  Name Primary?  . Left foot pain Yes  . Left ankle pain   . Sprain and strain    Crutches, camboot Declined pain meds, will use otc tylenol/ibuprofen RICE Xrays and official results given Advise to fu with ortho after trip to the beach , she will see her orthopedist in [address] for fu   Gross sideeffects, risk and benefits, and alternatives of medications d/w patient. Patient is aware that all medications have potential sideeffects and we are unable to predict every sideeffect or drug-drug interaction that may occur.  LE, THAO PHUONG, DO 10/10/2014 7:58 AM

## 2016-06-08 ENCOUNTER — Other Ambulatory Visit: Payer: Self-pay | Admitting: Obstetrics and Gynecology

## 2016-06-08 DIAGNOSIS — R928 Other abnormal and inconclusive findings on diagnostic imaging of breast: Secondary | ICD-10-CM

## 2016-06-08 DIAGNOSIS — N6489 Other specified disorders of breast: Secondary | ICD-10-CM

## 2016-06-15 ENCOUNTER — Ambulatory Visit
Admission: RE | Admit: 2016-06-15 | Discharge: 2016-06-15 | Disposition: A | Discharge status: Home / Self Care | Payer: BC Managed Care – PPO | Source: Ambulatory Visit | Attending: Obstetrics and Gynecology | Admitting: Obstetrics and Gynecology

## 2016-06-15 DIAGNOSIS — R928 Other abnormal and inconclusive findings on diagnostic imaging of breast: Secondary | ICD-10-CM

## 2016-06-15 DIAGNOSIS — N6489 Other specified disorders of breast: Secondary | ICD-10-CM

## 2017-07-21 ENCOUNTER — Other Ambulatory Visit: Payer: Self-pay | Admitting: Obstetrics and Gynecology

## 2017-07-21 DIAGNOSIS — Z1231 Encounter for screening mammogram for malignant neoplasm of breast: Secondary | ICD-10-CM

## 2017-08-16 ENCOUNTER — Ambulatory Visit
Admission: RE | Admit: 2017-08-16 | Discharge: 2017-08-16 | Disposition: A | Discharge status: Home / Self Care | Payer: BC Managed Care – PPO | Source: Ambulatory Visit | Attending: Obstetrics and Gynecology | Admitting: Obstetrics and Gynecology

## 2017-08-16 DIAGNOSIS — Z1231 Encounter for screening mammogram for malignant neoplasm of breast: Secondary | ICD-10-CM

## 2017-08-18 ENCOUNTER — Other Ambulatory Visit: Payer: Self-pay | Admitting: Obstetrics and Gynecology

## 2017-08-18 DIAGNOSIS — R928 Other abnormal and inconclusive findings on diagnostic imaging of breast: Secondary | ICD-10-CM

## 2017-08-26 ENCOUNTER — Ambulatory Visit
Admission: RE | Admit: 2017-08-26 | Discharge: 2017-08-26 | Disposition: A | Discharge status: Home / Self Care | Payer: BC Managed Care – PPO | Source: Ambulatory Visit | Attending: Obstetrics and Gynecology | Admitting: Obstetrics and Gynecology

## 2017-08-26 ENCOUNTER — Ambulatory Visit: Payer: BC Managed Care – PPO

## 2017-08-26 DIAGNOSIS — R928 Other abnormal and inconclusive findings on diagnostic imaging of breast: Secondary | ICD-10-CM

## 2017-09-01 ENCOUNTER — Ambulatory Visit: Payer: BC Managed Care – PPO | Admitting: Family Medicine

## 2017-09-01 ENCOUNTER — Other Ambulatory Visit: Payer: Self-pay

## 2017-09-01 ENCOUNTER — Other Ambulatory Visit (HOSPITAL_COMMUNITY)
Admission: RE | Admit: 2017-09-01 | Discharge: 2017-09-01 | Disposition: A | Discharge status: Home / Self Care | Payer: BC Managed Care – PPO | Source: Ambulatory Visit | Attending: Family Medicine | Admitting: Family Medicine

## 2017-09-01 ENCOUNTER — Encounter: Payer: Self-pay | Admitting: Family Medicine

## 2017-09-01 VITALS — BP 108/68 | HR 62 | Temp 98.0°F | Resp 14 | Ht 69.0 in | Wt 176.0 lb

## 2017-09-01 DIAGNOSIS — R3915 Urgency of urination: Secondary | ICD-10-CM | POA: Insufficient documentation

## 2017-09-01 DIAGNOSIS — N12 Tubulo-interstitial nephritis, not specified as acute or chronic: Secondary | ICD-10-CM | POA: Diagnosis not present

## 2017-09-01 DIAGNOSIS — D649 Anemia, unspecified: Secondary | ICD-10-CM

## 2017-09-01 MED ORDER — CIPROFLOXACIN HCL 500 MG PO TABS: 500.0000 mg | ORAL_TABLET | Freq: Two times a day (BID) | ORAL | 0 refills | Status: DC

## 2017-09-01 MED ORDER — CEFTRIAXONE SODIUM 500 MG IJ SOLR: 500.0000 mg | Freq: Once | INTRAMUSCULAR | Status: DC

## 2017-09-01 NOTE — Progress Notes (Signed)
Patient ID: [name], female    DOB: 08-01-1970, 47 y.o.   MRN: [id number]  Chief Complaint  Patient presents with  . Establish Care    4/24 UTI symptoms ob treated no improvement  . Dizziness    around 4/24  . Nausea  . Urinary Frequency    Allergies Patient has no known allergies.  Subjective:   [name] is a 47 y.o. female who presents to [facility] today.  HPI [name] presents as a new patient visit to establish care.  She has mainly been followed by her OB/GYN.  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  She reports that she was on spring break, 08/11/17, [place]. Started to feel like was getting a UTI. Had some pressure in bladder and did not feel good. Arrived at mothers house in [place] and symtpoms proceeded to get worse, started drinking cranberry juice in an attempt to help remedy the symptoms.  She did not improve and on 4/26 when was driving home, had some blood in urine and noticed this while wiping.  During this time she was experiencing urinary frequency, symptoms of incomplete bladder emptying, and squeezing pain in her bladder.  She called OB and told them that she felt like she had a UTI and had symptoms been like bladder was spasming. They called in antibiotic, started on Bactrim DS bid for 5 days. After 2 days on pills, not feeling any better. Went into the OB, they said that urine was ok via dipstick examination.  At that time, OB did a speculum exam and told her there was blood in the vagina. Told her she was on her menses but she reports that was not and had not noted any vaginal bleeding. Reports that since that time she has had continued urinary symptoms.  However, she reports that the symptoms have worsened over the past several days and she has felt nauseated, dizzy, back pain. Reports that now body hurts, feels achey, feels sweaty  and clammy.. Repots that feels like is getting another UTI with severe symptoms. Has had some low grade fevers, 101.  Reports that she has had some chills and over the weekend stayed in the bed for several days.  Denies any vomiting or diarrhea.  No skin rashes.  Reports that her muscles in her neck and her shoulders and body feel sore.  No neck stiffness present.   History reviewed. No pertinent past medical history.  Past Surgical History:  Procedure Laterality Date  . CESAREAN SECTION     x3  . HERNIA REPAIR     1991  . TUBAL LIGATION      Family History  Problem Relation Age of Onset  . Hyperlipidemia Mother   . Atrial fibrillation Mother   . Hyperlipidemia Father   . Heart disease Father   . Stroke Father   . Heart Problems Brother   . Cancer Maternal Grandmother   . Breast cancer Maternal Grandmother   . Cancer Maternal Grandfather   . Stroke Maternal Grandfather   . Cancer Paternal Grandmother   . Heart disease Paternal Grandfather   . Healthy Brother      Social History   Socioeconomic History  . Marital status: Married    Spouse name: Not on file  . Number of children: Not on file  . Years of education: Not on file  . Highest education  level: Not on file  Occupational History  . Not on file  Social Needs  . Financial resource strain: Not hard at all  . Food insecurity:    Worry: Never true    Inability: Never true  . Transportation needs:    Medical: No    Non-medical: No  Tobacco Use  . Smoking status: Never Smoker  . Smokeless tobacco: Never Used  Substance and Sexual Activity  . Alcohol use: No    Alcohol/week: 0.0 oz  . Drug use: No  . Sexual activity: Yes    Partners: Male    Birth control/protection: Surgical  Lifestyle  . Physical activity:    Days per week: 0 days    Minutes per session: 0 min  . Stress: Very much  Relationships  . Social connections:    Talks on phone: More than three times a week    Gets together: Three times a  week    Attends religious service: More than 4 times per year    Active member of club or organization: No    Attends meetings of clubs or organizations: Never    Relationship status: Married  Other Topics Concern  . Not on file  Social History Narrative   Live in [place], [place]   Grew up in [place].    Married, three daughters, 29, 8, 32.   Eats all food groups.   Wear seatbelt.   Exercise at times.    Review of Systems  Constitutional: Positive for appetite change, chills, fatigue and fever.  HENT: Negative for congestion, postnasal drip, rhinorrhea, sinus pressure, sinus pain, sneezing, sore throat, trouble swallowing and voice change.   Respiratory: Negative for cough, shortness of breath, wheezing and stridor.        Reports that did have a cough on Friday but then it went away.   Gastrointestinal: Positive for nausea. Negative for abdominal distention, abdominal pain, blood in stool, constipation, diarrhea and vomiting.  Genitourinary: Positive for frequency, menstrual problem and urgency. Negative for difficulty urinating, dysuria, vaginal bleeding and vaginal discharge.       LMP was 07/26/2017. Menses are usually heavy, usually irregular.  Does not burn with urination but feels like bladder is spasming and like does not completely empty bladder.  Back is not hurting now but it did several days ago.   Musculoskeletal:       Neck and shoulder muscles feel sore.   Skin: Negative for rash.       No bug/tick bites.   Neurological: Positive for dizziness and light-headedness. Negative for tremors, seizures, syncope, facial asymmetry, speech difficulty, weakness, numbness and headaches.       Reports that feels a bit dizzy, feels like she is outside of body, that when she turns her head that her body is trying to catch up with her. Reports that she feels like she is going to pass out at times.  Feels like she is unsteady. Does not feel like room is spinning. Just does not feel  well.   Hematological: Negative for adenopathy. Does not bruise/bleed easily.  Psychiatric/Behavioral: Negative for dysphoric mood. The patient is not nervous/anxious.      Objective:   BP 108/68 (BP Location: Left Arm, Patient Position: Sitting, Cuff Size: Large)   Pulse 62   Temp 98 F (36.7 C) (Oral)   Resp 14   Ht  (1.753 m)   Wt 176 lb 0.6 oz (79.9 kg)   LMP 07/26/2017   SpO2 99%  BMI 26.00 kg/m   Physical Exam  Constitutional: She is oriented to person, place, and time. She appears well-developed and well-nourished. No distress.  Patient is alert and oriented.  He is in no acute distress.  However, it is obvious that she does not feel well.  HENT:  Head: Normocephalic and atraumatic.  Mouth/Throat: Oropharynx is clear and moist. No oropharyngeal exudate.  Eyes: Pupils are equal, round, and reactive to light. Conjunctivae are normal. No scleral icterus.  Neck: Normal range of motion. Neck supple. No JVD present. No tracheal deviation present. No thyromegaly present.  Cardiovascular: Normal rate, regular rhythm, normal heart sounds and intact distal pulses.  Pulmonary/Chest: Effort normal and breath sounds normal. No respiratory distress.  Abdominal: Soft. Bowel sounds are normal. She exhibits no distension and no mass. There is no hepatosplenomegaly. There is no tenderness. There is no rebound, no guarding and no CVA tenderness.  Musculoskeletal: Normal range of motion. She exhibits no edema.  Lymphadenopathy:    She has no cervical adenopathy.  Neurological: She is alert and oriented to person, place, and time. No cranial nerve deficit.  Skin: Skin is warm and dry. Capillary refill takes less than 2 seconds. No rash noted. No erythema.  Psychiatric: She has a normal mood and affect. Her behavior is normal. Judgment and thought content normal.  Nursing note and vitals reviewed.    Assessment and Plan  1. Pyelonephritis Discussed with patient that I suspect her  symptoms are related to pyelonephritis.  We will go ahead and treat with a dose of Rocephin 1 g in the office at this time.  We will subsequently start Cipro as directed.  Will send urine for culture and sensitivity.  Due to systemic symptoms we will check labs.  Patient will increase her fluids.  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  She was counseled regarding worrisome signs and symptoms of a worsening infection.  She was told if she develops fevers, vomiting, worsening back pain or abdominal pain that she needs to go to the emergency department.  She voiced understanding. - COMPLETE METABOLIC PANEL WITH GFR - CBC with Differential/Platelet - Urine Culture - ciprofloxacin (CIPRO) 500 MG tablet; Take 1 tablet (500 mg total) by mouth 2 (two) times daily.  Dispense: 10 tablet; Refill: 0  2. Urinary urgency Will check labs.  Patient agreeable to testing. - POCT urinalysis dipstick - Urine cytology ancillary only - HIV antibody - RPR - Hepatitis panel, acute  Patient will call with any questions or concerns.  We will also let patient know regarding lab work.  Recommend repeat urine in 2 to 4 weeks as long as patient's symptoms are improving.  Await culture. Office visit was 45 minutes.  Greater than 50% of office visit was spent counseling and coordinating care.  Today we did discuss the diagnosis and treatment of pyelonephritis.  We also discussed the risks of medications that were prescribed.  All of her questions were answered.  Recommend she follow-up with her OB/GYN due to her menstrual cycle irregularities which have been going on for multiple years.  Also recommend patient return to clinic for routine health maintenance.  Recommend hepatitis vaccination due to exposures at her job. Return in about 1 month (around 09/29/2017). Aliene Beams, MD 09/01/2017

## 2017-09-01 NOTE — Patient Instructions (Signed)
Pyelonephritis, Adult Pyelonephritis is a kidney infection. The kidneys are organs that help clean your blood by moving waste out of your blood and into your pee (urine). This infection can happen quickly, or it can last for a long time. In most cases, it clears up with treatment and does not cause other problems. Follow these instructions at home: Medicines  Take over-the-counter and prescription medicines only as told by your doctor.  Take your antibiotic medicine as told by your doctor. Do not stop taking the medicine even if you start to feel better. General instructions  Drink enough fluid to keep your pee clear or pale yellow.  Avoid caffeine, tea, and carbonated drinks.  Pee (urinate) often. Avoid holding in pee for long periods of time.  Pee before and after sex.  After pooping (having a bowel movement), women should wipe from front to back. Use each tissue only once.  Keep all follow-up visits as told by your doctor. This is important. Contact a doctor if:  You do not feel better after 2 days.  Your symptoms get worse.  You have a fever. Get help right away if:  You cannot take your medicine or drink fluids as told.  You have chills and shaking.  You throw up (vomit).  You have very bad pain in your side (flank) or back.  You feel very weak or you pass out (faint). This information is not intended to replace advice given to you by your health care provider. Make sure you discuss any questions you have with your health care provider. Document Released: 05/14/2004 Document Revised: 09/12/2015 Document Reviewed: 07/30/2014 Elsevier Interactive Patient Education  2018 Elsevier Inc.  

## 2017-09-02 ENCOUNTER — Ambulatory Visit: Payer: BC Managed Care – PPO | Admitting: Family Medicine

## 2017-09-02 DIAGNOSIS — N12 Tubulo-interstitial nephritis, not specified as acute or chronic: Secondary | ICD-10-CM

## 2017-09-02 MED ORDER — CEFTRIAXONE SODIUM 500 MG IJ SOLR
1000.0000 mg | Freq: Once | INTRAMUSCULAR | Status: AC
  Administered 2017-09-02: 1000 mg via INTRAMUSCULAR

## 2017-09-03 ENCOUNTER — Telehealth: Payer: Self-pay | Admitting: Family Medicine

## 2017-09-03 ENCOUNTER — Ambulatory Visit: Payer: BC Managed Care – PPO | Admitting: Family Medicine

## 2017-09-03 DIAGNOSIS — R5383 Other fatigue: Secondary | ICD-10-CM

## 2017-09-03 DIAGNOSIS — D649 Anemia, unspecified: Secondary | ICD-10-CM

## 2017-09-03 LAB — COMPLETE METABOLIC PANEL WITH GFR
AG RATIO: 1.4 (calc) (ref 1.0–2.5)
ALT: 14 U/L (ref 6–29)
AST: 20 U/L (ref 10–35)
Albumin: 4.1 g/dL (ref 3.6–5.1)
Alkaline phosphatase (APISO): 84 U/L (ref 33–115)
BILIRUBIN TOTAL: 0.3 mg/dL (ref 0.2–1.2)
BUN: 14 mg/dL (ref 7–25)
CHLORIDE: 104 mmol/L (ref 98–110)
CO2: 27 mmol/L (ref 20–32)
Calcium: 9.6 mg/dL (ref 8.6–10.2)
Creat: 0.94 mg/dL (ref 0.50–1.10)
GFR, EST AFRICAN AMERICAN: 85 mL/min/{1.73_m2} (ref >=60)
GFR, Est Non African American: 73 mL/min/{1.73_m2} (ref >=60)
Globulin: 2.9 g/dL (calc) (ref 1.9–3.7)
Glucose, Bld: 85 mg/dL (ref 65–139)
POTASSIUM: 4.4 mmol/L (ref 3.5–5.3)
Sodium: 138 mmol/L (ref 135–146)
TOTAL PROTEIN: 7 g/dL (ref 6.1–8.1)

## 2017-09-03 LAB — POCT URINALYSIS DIPSTICK
BILIRUBIN UA: NEGATIVE
Glucose, UA: NEGATIVE
KETONES UA: NEGATIVE
NITRITE UA: NEGATIVE
PH UA: 5 (ref 5.0–8.0)
PROTEIN UA: NEGATIVE
Spec Grav, UA: 1.025 (ref 1.010–1.025)
UROBILINOGEN UA: 0.2 U/dL

## 2017-09-03 LAB — CBC WITH DIFFERENTIAL/PLATELET
BASOS ABS: 41 {cells}/uL (ref 0–200)
Basophils Relative: 0.7 %
Eosinophils Absolute: 71 cells/uL (ref 15–500)
Eosinophils Relative: 1.2 %
HCT: 31.4 % — ABNORMAL LOW (ref 35.0–45.0)
HEMOGLOBIN: 9.5 g/dL — AB (ref 11.7–15.5)
LYMPHS ABS: 1581 {cells}/uL (ref 850–3900)
MCH: 20.9 pg — ABNORMAL LOW (ref 27.0–33.0)
MCHC: 30.3 g/dL — AB (ref 32.0–36.0)
MCV: 69.2 fL — AB (ref 80.0–100.0)
MPV: 9.8 fL (ref 7.5–12.5)
Monocytes Relative: 7.9 %
NEUTROS ABS: 3741 {cells}/uL (ref 1500–7800)
Neutrophils Relative %: 63.4 %
Platelets: 398 10*3/uL (ref 140–400)
RBC: 4.54 10*6/uL (ref 3.80–5.10)
RDW: 15.6 % — ABNORMAL HIGH (ref 11.0–15.0)
Total Lymphocyte: 26.8 %
WBC: 5.9 10*3/uL (ref 3.8–10.8)
WBCMIX: 466 {cells}/uL (ref 200–950)

## 2017-09-03 LAB — URINE CYTOLOGY ANCILLARY ONLY
Chlamydia: NEGATIVE
NEISSERIA GONORRHEA: NEGATIVE
Trichomonas: NEGATIVE

## 2017-09-03 LAB — HEPATITIS PANEL, ACUTE
HEP A IGM: NONREACTIVE
HEP C AB: NONREACTIVE
Hep B C IgM: NONREACTIVE
Hepatitis B Surface Ag: NONREACTIVE
SIGNAL TO CUT-OFF: 0.02 (ref <1.00)

## 2017-09-03 LAB — HIV ANTIBODY (ROUTINE TESTING W REFLEX): HIV 1&2 Ab, 4th Generation: NONREACTIVE

## 2017-09-03 LAB — URINE CULTURE
MICRO NUMBER:: 90593529
SPECIMEN QUALITY:: ADEQUATE

## 2017-09-03 LAB — RPR: RPR: NONREACTIVE

## 2017-09-03 MED ORDER — FERROUS SULFATE 325 (65 FE) MG PO TABS: 325.0000 mg | ORAL_TABLET | Freq: Two times a day (BID) | ORAL | 3 refills | Status: AC

## 2017-09-03 MED ORDER — DOCUSATE SODIUM 100 MG PO CAPS: 100.0000 mg | ORAL_CAPSULE | Freq: Every day | ORAL | 0 refills | Status: DC | PRN

## 2017-09-03 NOTE — Telephone Encounter (Signed)
Called again asking you to call her asap

## 2017-09-03 NOTE — Telephone Encounter (Signed)
Called patient to review laboratory testing and urinary studies.  Still waiting on urine culture and some of labs to come back.  We did discuss her anemia.  Recommended starting ferrous sulfate 325 p.o. twice daily.  Will take with acidic beverage/orange juice to aid in absorption.  We will also use Colace stool softener as needed if develops any constipation.  Did order iron studies, thyroid, B12, vitamin D.  She will keep her scheduled follow-up in the office.  Will consider referral for IV/and iron injections if not improved.  She defers uterine ablation.  Anemia is secondary to menorrhagia. Will call patient once get her urine culture back.  She is continuing antibiotics at this time.  She does have decrease in her urinary symptoms and is feeling better.  She does still report some fatigue and occasional dizzy symptoms.  Otherwise she is feeling better.  Her questions were answered.  Medication sent in for refill.  She will keep her scheduled follow-up.  We will be in touch with labs.

## 2017-09-07 LAB — URINE CYTOLOGY ANCILLARY ONLY
Bacterial vaginitis: NEGATIVE
CANDIDA VAGINITIS: NEGATIVE

## 2017-09-08 NOTE — Progress Notes (Signed)
lvm 5/22

## 2017-09-08 NOTE — Progress Notes (Signed)
Left message requesting  call back.

## 2017-09-14 ENCOUNTER — Ambulatory Visit: Payer: BC Managed Care – PPO | Admitting: Family Medicine

## 2017-09-24 ENCOUNTER — Encounter: Payer: Self-pay | Admitting: Family Medicine

## 2017-10-01 ENCOUNTER — Encounter: Payer: Self-pay | Admitting: Family Medicine

## 2017-10-01 ENCOUNTER — Ambulatory Visit (INDEPENDENT_AMBULATORY_CARE_PROVIDER_SITE_OTHER): Payer: BC Managed Care – PPO | Admitting: Family Medicine

## 2017-10-01 ENCOUNTER — Other Ambulatory Visit: Payer: Self-pay

## 2017-10-01 VITALS — BP 118/60 | HR 60 | Temp 98.2°F | Resp 14 | Ht 69.0 in | Wt 173.1 lb

## 2017-10-01 DIAGNOSIS — N12 Tubulo-interstitial nephritis, not specified as acute or chronic: Secondary | ICD-10-CM | POA: Diagnosis not present

## 2017-10-01 DIAGNOSIS — D649 Anemia, unspecified: Secondary | ICD-10-CM

## 2017-10-01 LAB — POCT URINALYSIS DIPSTICK
BILIRUBIN UA: NEGATIVE
Glucose, UA: NEGATIVE
Ketones, UA: NEGATIVE
Nitrite, UA: NEGATIVE
PH UA: 5.5 (ref 5.0–8.0)
Protein, UA: NEGATIVE
Spec Grav, UA: 1.025 (ref 1.010–1.025)
UROBILINOGEN UA: 0.2 U/dL

## 2017-10-01 NOTE — Progress Notes (Signed)
Patient ID: [name], female    DOB: [birth year], 47 y.o.   MRN: [id number]  Chief Complaint  Patient presents with  . Pyelonephritis    follow up    Allergies Patient has no known allergies.  Subjective:   [name] is a 47 y.o. female who presents to [facility] today.  HPI Here for follow up from UTI/pyelonephritis. Labs done at that visit revealed anemia. Has not come back for labs yet. Taking iron pills bid. Reports energy is better. Mood is good. Feels so much better. Not chewing on ice. Has a gynecologic exam scheduled with her Ob/Gyn. Has not had a menses since her last. No dysuria. No back pain. No fever, nausea, vomiting. Overall, reports that feels better than she has in a long time. No abdominal pain.  Has been feeling better. Reports that has had low energy and chewed ice since the birth of child. BM normal. Has not been taking vitamin.    No past medical history on file.  Past Surgical History:  Procedure Laterality Date  . CESAREAN SECTION     x3  . HERNIA REPAIR     1991  . TUBAL LIGATION      Family History  Problem Relation Age of Onset  . Hyperlipidemia Mother   . Atrial fibrillation Mother   . Hyperlipidemia Father   . Heart disease Father   . Stroke Father   . Heart Problems Brother   . Cancer Maternal Grandmother   . Breast cancer Maternal Grandmother   . Cancer Maternal Grandfather   . Stroke Maternal Grandfather   . Cancer Paternal Grandmother   . Heart disease Paternal Grandfather   . Healthy Brother      Social History   Socioeconomic History  . Marital status: Married    Spouse name: Not on file  . Number of children: Not on file  . Years of education: Not on file  . Highest education level: Not on file  Occupational History  . Not on file  Social Needs  . Financial resource strain: Not hard at all  . Food insecurity:    Worry: Never true    Inability: Never true  . Transportation needs:   Medical: No    Non-medical: No  Tobacco Use  . Smoking status: Never Smoker  . Smokeless tobacco: Never Used  Substance and Sexual Activity  . Alcohol use: No    Alcohol/week: 0.0 oz  . Drug use: No  . Sexual activity: Yes    Partners: Male    Birth control/protection: Surgical  Lifestyle  . Physical activity:    Days per week: 0 days    Minutes per session: 0 min  . Stress: Very much  Relationships  . Social connections:    Talks on phone: More than three times a week    Gets together: Three times a week    Attends religious service: More than 4 times per year    Active member of club or organization: No    Attends meetings of clubs or organizations: Never    Relationship status: Married  Other Topics Concern  . Not on file  Social History Narrative   Live in [place], [place]   Grew up in [place].    Married, three daughters, 12, 8, 20.   Eats all food groups.   Wear seatbelt.   Exercise at times.    Review of Systems  Constitutional: Negative for activity change, appetite change,  chills, fatigue, fever and unexpected weight change.  Respiratory: Negative for shortness of breath.   Cardiovascular: Negative for chest pain.  Genitourinary: Negative for decreased urine volume, difficulty urinating, dysuria, flank pain, frequency, hematuria, pelvic pain, urgency, vaginal bleeding, vaginal discharge and vaginal pain.  Neurological: Negative for dizziness and light-headedness.  Hematological: Negative for adenopathy.  Psychiatric/Behavioral: Negative for dysphoric mood and sleep disturbance.     Objective:   BP 118/60 (BP Location: Left Arm, Patient Position: Sitting, Cuff Size: Normal)   Pulse 60   Temp 98.2 F (36.8 C) (Temporal)   Resp 14   Ht 5\' 9"  (1.753 m)   Wt 173 lb 1.9 oz (78.5 kg)   SpO2 97%   BMI 25.57 kg/m   Physical Exam  Constitutional: She is oriented to person, place, and time. She appears well-developed and well-nourished. No distress.    HENT:  Head: Normocephalic and atraumatic.  Eyes: Pupils are equal, round, and reactive to light.  Neck: Normal range of motion. Neck supple. No thyromegaly present.  Cardiovascular: Normal rate, regular rhythm and normal heart sounds.  Pulmonary/Chest: Effort normal and breath sounds normal. No respiratory distress.  Neurological: She is alert and oriented to person, place, and time. No cranial nerve deficit.  Skin: Skin is warm and dry.  Psychiatric: She has a normal mood and affect. Her behavior is normal. Judgment and thought content normal.  Nursing note and vitals reviewed.    Assessment and Plan  1. Pyelonephritis Symptoms resolved with cipro. E coli was resistant to bactrim. POCT with trace blood and WBC. Check microscopy and culture.  - POCT urinalysis dipstick - CBC with Differential/Platelet - Urinalysis, Routine w reflex microscopic - Urine Culture  2. Anemia, unspecified type RTC for labs and iron studies in 2 months. Continue iron po bid. Counseled on use.   3. Fatigue Improved. Check labs.  Follow up with gynecology perimenopausal/menstral irregularities.   Return in about 3 months (around 01/01/2018). [name], MD 10/01/2017

## 2017-10-01 NOTE — Patient Instructions (Signed)
Come back to the lab in August and get your blood work done.

## 2017-10-03 LAB — URINE CULTURE
MICRO NUMBER: 90718441
SPECIMEN QUALITY: ADEQUATE

## 2017-10-03 LAB — URINALYSIS, ROUTINE W REFLEX MICROSCOPIC
BACTERIA UA: NONE SEEN /HPF
Bilirubin Urine: NEGATIVE
Glucose, UA: NEGATIVE
Hyaline Cast: NONE SEEN /LPF
Ketones, ur: NEGATIVE
Nitrite: NEGATIVE
PROTEIN: NEGATIVE
Specific Gravity, Urine: 1.019 (ref 1.001–1.03)
pH: <=5 (ref 5.0–8.0)

## 2017-10-04 ENCOUNTER — Encounter: Payer: Self-pay | Admitting: Family Medicine

## 2017-11-30 ENCOUNTER — Ambulatory Visit: Payer: BC Managed Care – PPO | Admitting: Family Medicine

## 2019-01-19 ENCOUNTER — Other Ambulatory Visit: Payer: Self-pay | Admitting: Obstetrics and Gynecology

## 2019-01-19 DIAGNOSIS — Z1231 Encounter for screening mammogram for malignant neoplasm of breast: Secondary | ICD-10-CM

## 2019-01-27 ENCOUNTER — Ambulatory Visit: Payer: BC Managed Care – PPO

## 2019-02-06 ENCOUNTER — Ambulatory Visit
Admission: RE | Admit: 2019-02-06 | Discharge: 2019-02-06 | Disposition: A | Discharge status: Home / Self Care | Payer: BC Managed Care – PPO | Source: Ambulatory Visit | Attending: Obstetrics and Gynecology | Admitting: Obstetrics and Gynecology

## 2019-02-06 ENCOUNTER — Other Ambulatory Visit: Payer: Self-pay

## 2019-02-06 DIAGNOSIS — Z1231 Encounter for screening mammogram for malignant neoplasm of breast: Secondary | ICD-10-CM

## 2019-02-07 ENCOUNTER — Other Ambulatory Visit: Payer: Self-pay | Admitting: Obstetrics and Gynecology

## 2019-02-07 DIAGNOSIS — R928 Other abnormal and inconclusive findings on diagnostic imaging of breast: Secondary | ICD-10-CM

## 2019-02-10 ENCOUNTER — Other Ambulatory Visit: Payer: Self-pay

## 2019-02-10 ENCOUNTER — Ambulatory Visit: Payer: BC Managed Care – PPO

## 2019-02-10 ENCOUNTER — Ambulatory Visit
Admission: RE | Admit: 2019-02-10 | Discharge: 2019-02-10 | Disposition: A | Discharge status: Home / Self Care | Payer: BC Managed Care – PPO | Source: Ambulatory Visit | Attending: Obstetrics and Gynecology | Admitting: Obstetrics and Gynecology

## 2019-02-10 DIAGNOSIS — R928 Other abnormal and inconclusive findings on diagnostic imaging of breast: Secondary | ICD-10-CM

## 2019-06-25 IMAGING — MG DIGITAL SCREENING BILATERAL MAMMOGRAM WITH TOMO AND CAD
8 series · 8 of 24 positions shown · non-contrast
Comparison: Previous exam(s).

CLINICAL DATA: Screening.

EXAM:
DIGITAL SCREENING BILATERAL MAMMOGRAM WITH TOMO AND CAD

[L CC synth-2D]
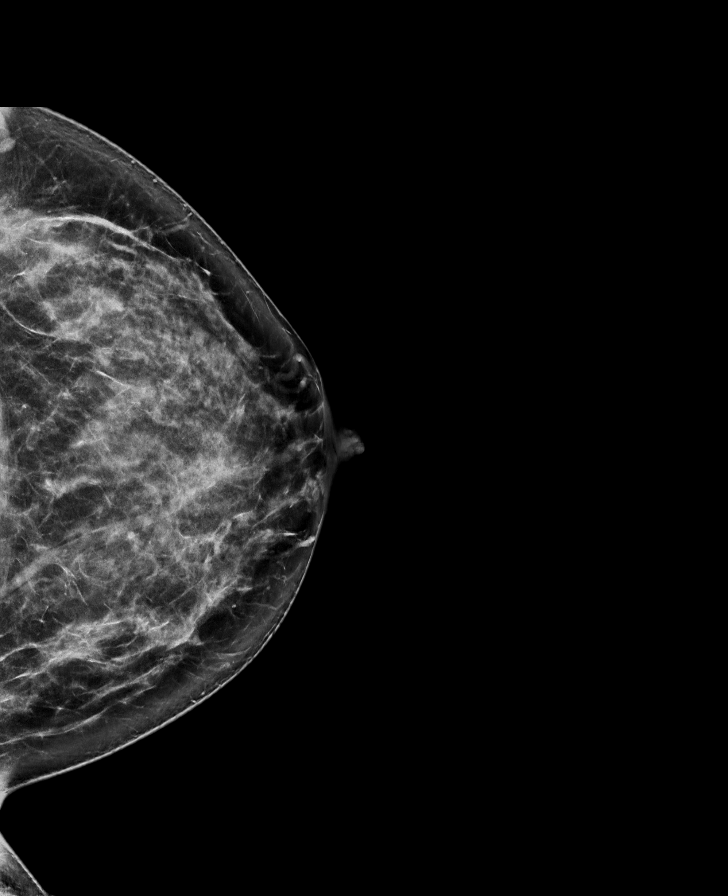

[R MLO synth-2D]
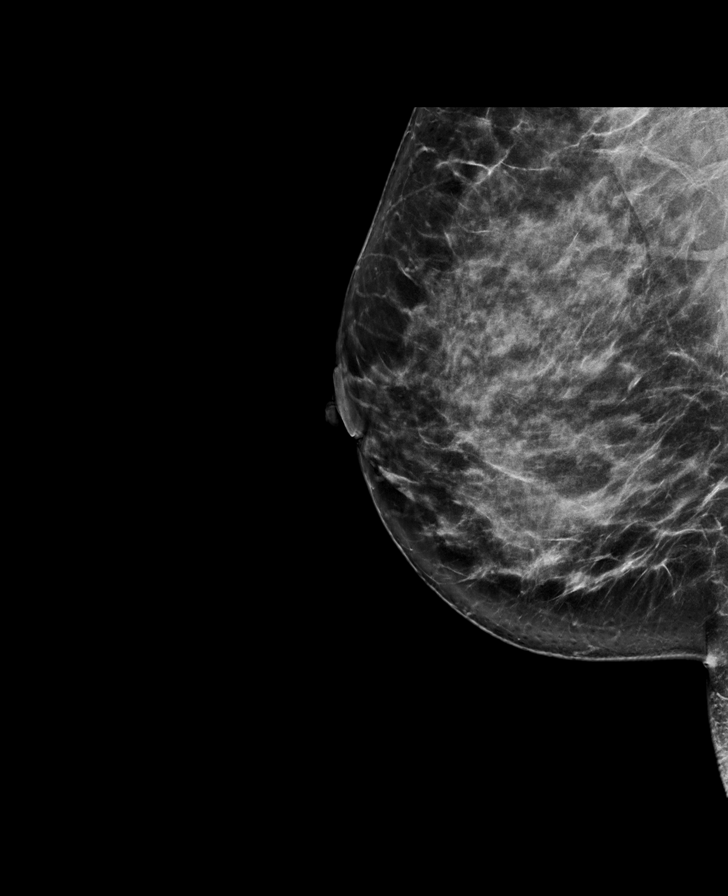

[L MLO synth-2D]
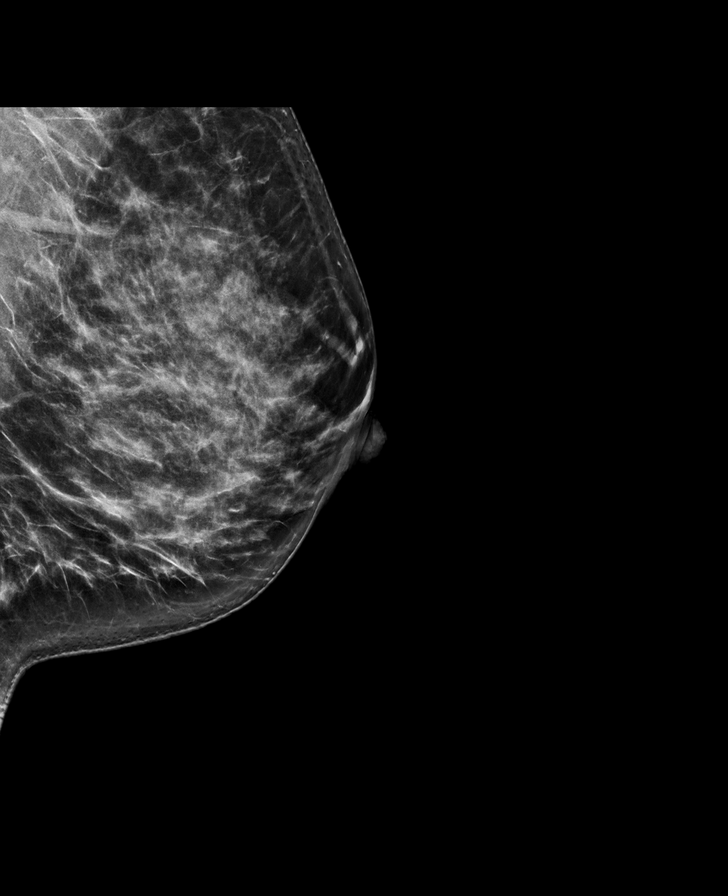

[R CC synth-2D]
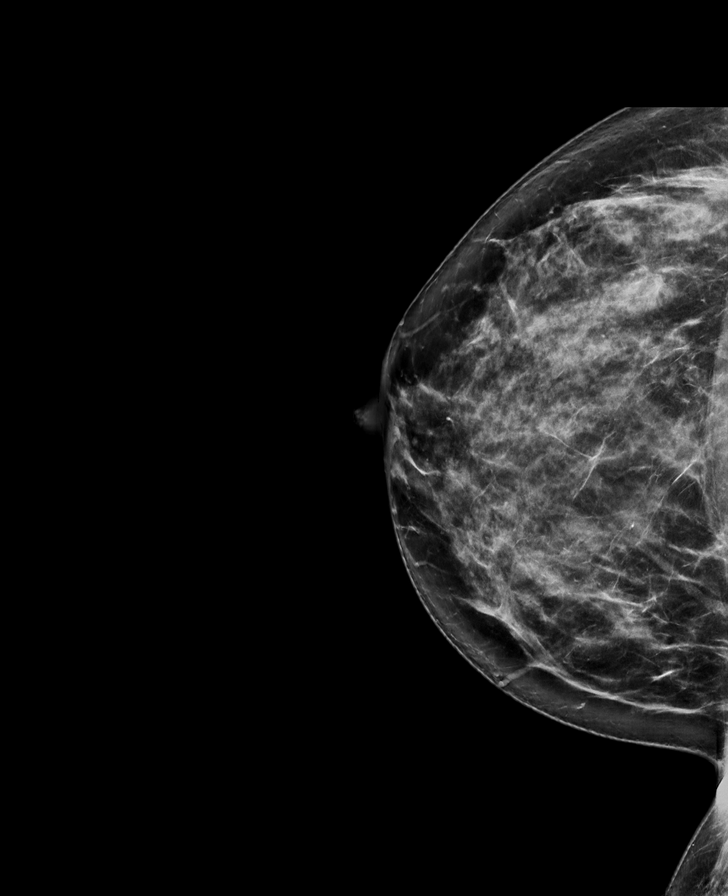

[L MLO tomo · tomo slice 40/79.0]
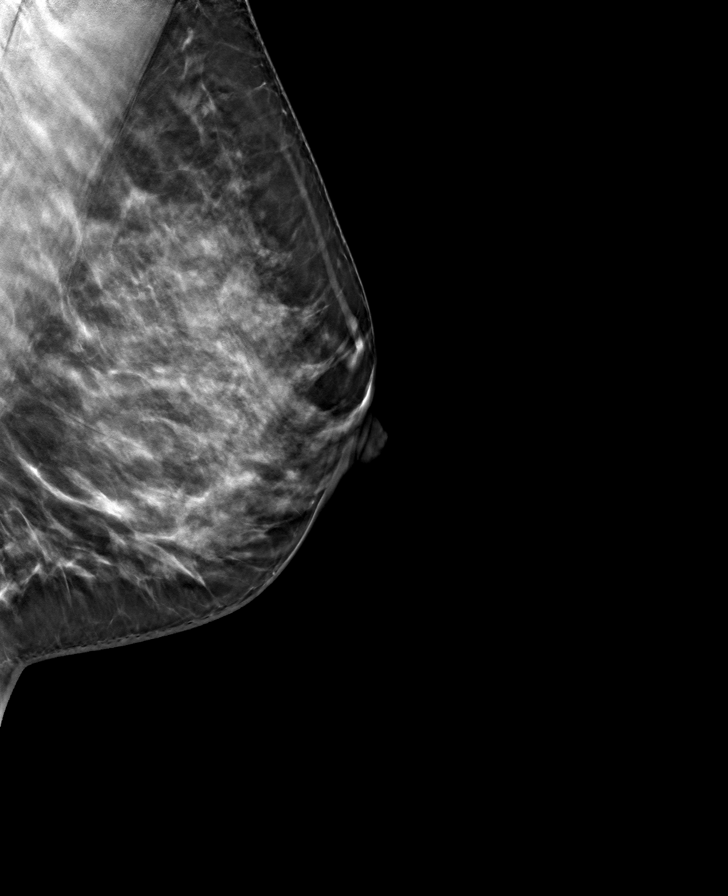

[R CC tomo · tomo slice 43/85.0]
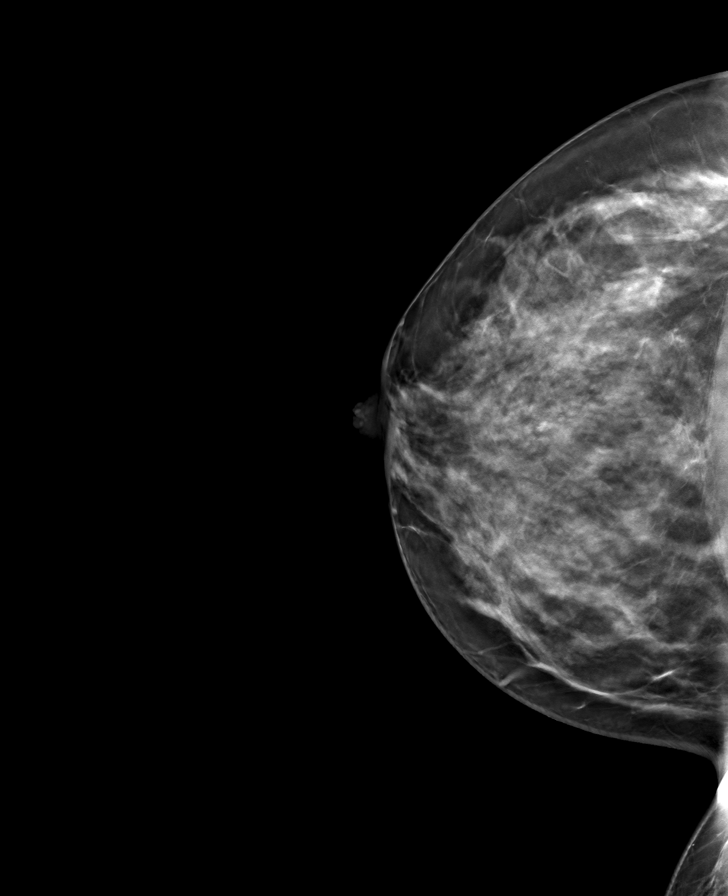

[L CC tomo · tomo slice 41/81.0]
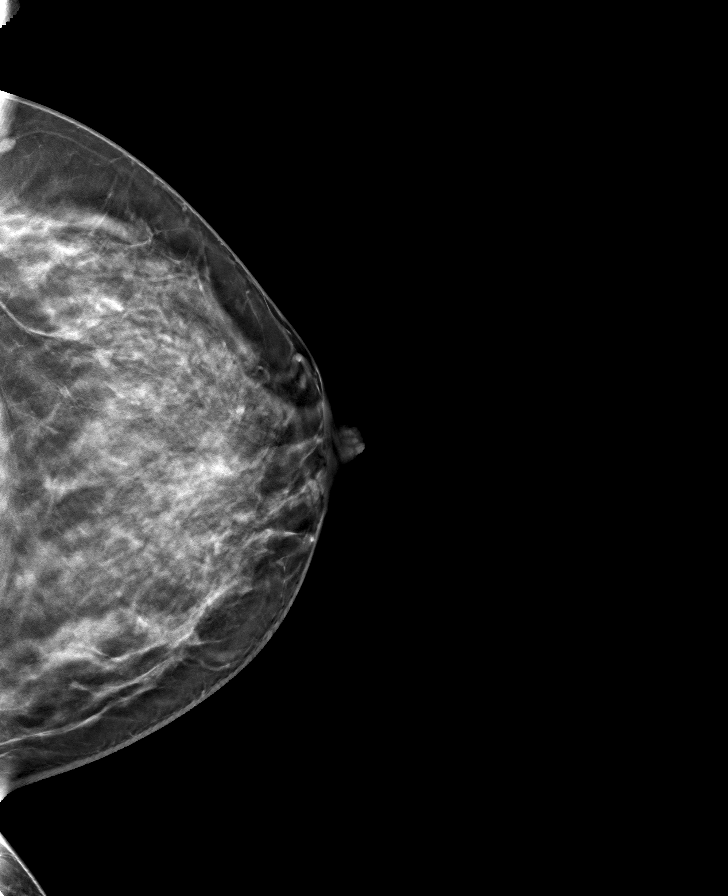

[R MLO tomo · tomo slice 43/86.0]
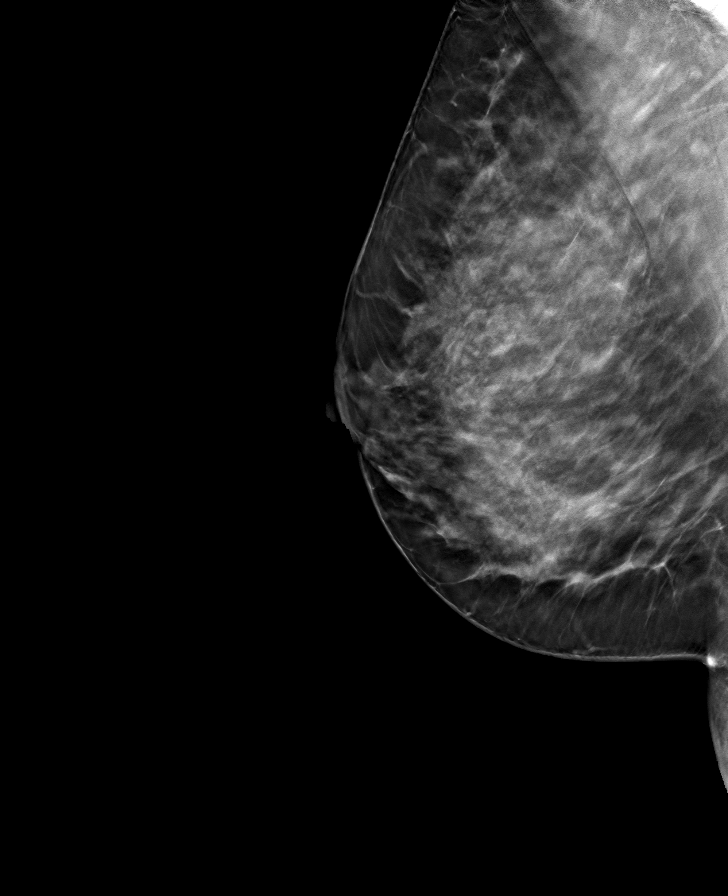

[8 of 24 positions shown; findings below may reference images not displayed]

ACR Breast Density Category d: The breast tissue is extremely dense,
which lowers the sensitivity of mammography.
FINDINGS: In the left breast, a possible asymmetry warrants further
evaluation. In the right breast, no findings suspicious for
malignancy. Images were processed with CAD.
IMPRESSION: Further evaluation is suggested for possible asymmetry in the left
breast.

RECOMMENDATION:
Diagnostic mammogram and possibly ultrasound of the left breast.
(Code:W0-2-002)

The patient will be contacted regarding the findings, and additional
imaging will be scheduled.

BI-RADS CATEGORY  0: Incomplete. Need additional imaging evaluation
and/or prior mammograms for comparison.

## 2020-04-04 ENCOUNTER — Other Ambulatory Visit: Payer: Self-pay | Admitting: Obstetrics and Gynecology

## 2020-04-04 DIAGNOSIS — Z1231 Encounter for screening mammogram for malignant neoplasm of breast: Secondary | ICD-10-CM

## 2020-04-18 ENCOUNTER — Ambulatory Visit
Admission: RE | Admit: 2020-04-18 | Discharge: 2020-04-18 | Disposition: A | Discharge status: Home / Self Care | Payer: BC Managed Care – PPO | Source: Ambulatory Visit | Attending: Obstetrics and Gynecology | Admitting: Obstetrics and Gynecology

## 2020-04-18 ENCOUNTER — Ambulatory Visit: Payer: BC Managed Care – PPO

## 2020-04-18 ENCOUNTER — Other Ambulatory Visit: Payer: Self-pay

## 2020-04-18 DIAGNOSIS — Z1231 Encounter for screening mammogram for malignant neoplasm of breast: Secondary | ICD-10-CM

## 2022-10-04 ENCOUNTER — Ambulatory Visit
Admission: EM | Admit: 2022-10-04 | Discharge: 2022-10-04 | Disposition: A | Discharge status: Home / Self Care | Payer: BC Managed Care – PPO | Attending: Physician Assistant | Admitting: Physician Assistant

## 2022-10-04 ENCOUNTER — Other Ambulatory Visit: Payer: Self-pay

## 2022-10-04 ENCOUNTER — Encounter: Payer: Self-pay | Admitting: Emergency Medicine

## 2022-10-04 DIAGNOSIS — L255 Unspecified contact dermatitis due to plants, except food: Secondary | ICD-10-CM

## 2022-10-04 MED ORDER — METHYLPREDNISOLONE SODIUM SUCC 125 MG IJ SOLR
80.0000 mg | Freq: Once | INTRAMUSCULAR | Status: AC
  Administered 2022-10-04: 80 mg via INTRAMUSCULAR

## 2022-10-04 MED ORDER — PREDNISONE 10 MG (21) PO TBPK: ORAL_TABLET | ORAL | 0 refills | Status: AC

## 2022-10-04 NOTE — ED Provider Notes (Signed)
[facility]    CSN: [id number] Arrival date & time: 10/04/22  4098      History   Chief Complaint Chief Complaint  Patient presents with   Rash    HPI [name] is a 52 y.o. female.   Patient presents today with a 2-day history of intensely pruritic rash that has been spreading.  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  She was very intentional about avoiding contact but then developed a rash soon after this.  She reports this rash is spreading and now involves her neck and face.  She has tried topical medications including calamine lotion and topical steroids without improvement of symptoms.  Denies any additional changes to personal hygiene products including soaps or detergents.  Denies additional exposure to any plants, insects, animals.  She denies any shortness of breath, difficulty swallowing, GI symptoms.  Denies history of diabetes.    History reviewed. No pertinent past medical history.  There are no problems to display for this patient.   Past Surgical History:  Procedure Laterality Date   CESAREAN SECTION     x3   HERNIA REPAIR     1991   TUBAL LIGATION      OB History   No obstetric history on file.      Home Medications    Prior to Admission medications   Medication Sig Start Date End Date Taking? Authorizing Provider  predniSONE (STERAPRED UNI-PAK 21 TAB) 10 MG (21) TBPK tablet As directed 10/04/22  Yes Trenace Coughlin K, PA-C  ferrous sulfate 325 (65 FE) MG tablet Take 1 tablet (325 mg total) by mouth 2 (two) times daily with a meal. 09/03/17   Aliene Beams, MD    Family History Family History  Problem Relation Age of Onset   Hyperlipidemia Mother    Atrial fibrillation Mother    Hyperlipidemia Father    Heart disease Father    Stroke Father    Heart Problems Brother    Cancer Maternal Grandmother    Breast cancer Maternal  Grandmother    Cancer Maternal Grandfather    Stroke Maternal Grandfather    Cancer Paternal Grandmother    Heart disease Paternal Grandfather    Healthy Brother     Social History Social History   Tobacco Use   Smoking status: Never   Smokeless tobacco: Never  Substance Use Topics   Alcohol use: No    Alcohol/week: 0.0 standard drinks of alcohol   Drug use: No     Allergies   Patient has no known allergies.   Review of Systems Review of Systems  Constitutional:  Positive for activity change. Negative for appetite change, fatigue and fever.  HENT:  Negative for trouble swallowing and voice change.   Respiratory:  Negative for shortness of breath.   Cardiovascular:  Negative for chest pain.  Gastrointestinal:  Negative for abdominal pain, diarrhea, nausea and vomiting.  Skin:  Positive for rash.     Physical Exam Triage Vital Signs ED Triage Vitals  Enc Vitals Group     BP 10/04/22 0947 102/66     Pulse Rate 10/04/22 0947 62     Resp 10/04/22 0947 20     Temp 10/04/22 0947 97.6 F (36.4 C)     Temp Source 10/04/22 0947 Oral     SpO2 10/04/22 0947 94 %     Weight --  Height --      Head Circumference --      Peak Flow --      Pain Score 10/04/22 0945 0     Pain Loc --      Pain Edu? --      Excl. in GC? --    No data found.  Updated Vital Signs BP 102/66 (BP Location: Right Arm)   Pulse 62   Temp 97.6 F (36.4 C) (Oral)   Resp 20   LMP 01/27/2019   SpO2 94%   Visual Acuity Right Eye Distance:   Left Eye Distance:   Bilateral Distance:    Right Eye Near:   Left Eye Near:    Bilateral Near:     Physical Exam Vitals reviewed.  Constitutional:      General: She is awake. She is not in acute distress.    Appearance: Normal appearance. She is well-developed. She is not ill-appearing.     Comments: Very pleasant female appears stated age in no acute distress sitting comfortably in exam room.  HENT:     Head: Normocephalic and atraumatic.   Cardiovascular:     Rate and Rhythm: Normal rate and regular rhythm.     Heart sounds: Normal heart sounds, S1 normal and S2 normal. No murmur heard. Pulmonary:     Effort: Pulmonary effort is normal.     Breath sounds: Normal breath sounds. No wheezing, rhonchi or rales.     Comments: Clear to auscultation bilaterally Skin:    Findings: Rash present. Rash is macular, papular and vesicular.     Comments: Widespread maculopapular rash with linear vesicles noted bilateral forearms, left anterior neck, face.  Evidence of excoriation noted.  Psychiatric:        Behavior: Behavior is cooperative.      UC Treatments / Results  Labs (all labs ordered are listed, but only abnormal results are displayed) Labs Reviewed - No data to display  EKG   Radiology No results found.  Procedures Procedures (including critical care time)  Medications Ordered in UC Medications  methylPREDNISolone sodium succinate (SOLU-MEDROL) 125 mg/2 mL injection 80 mg (has no administration in time range)    Initial Impression / Assessment and Plan / UC Course  I have reviewed the triage vital signs and the nursing notes.  Pertinent labs & imaging results that were available during my care of the patient were reviewed by me and considered in my medical decision making (see chart for details).     Patient is well-appearing, afebrile, nontoxic, nontachycardic.  Given widespread and rapid progression of rash did recommend systemic steroids to treat her symptoms.  Patient had never taken steroids before and was initially hesitant for a large course of steroids.  She was open to injection of Solu-Medrol so this was given in clinic.  Discussed that I believe she will also require oral steroids and so was prescribed prednisone.  She will hold off on initiating this to see if injection is enough to manage her symptoms along with over-the-counter medications such as antihistamines.  If her symptoms persist she will  begin steroid taper.  Discussed that she is to avoid NSAIDs with this medication due to risk of GI bleeding.  We did discuss short-term and long-term side effects of this medication.  Recommend close follow-up with her primary care.  If she has any worsening symptoms she is to return for reevaluation.  Final Clinical Impressions(s) / UC Diagnoses   Final diagnoses:  Rhus dermatitis  Discharge Instructions      We gave an injection of Solu-Medrol today.  I am hoping this will help with your reaction.  I do recommend over-the-counter antihistamines including cetirizine 10 mg and famotidine (branded as Pepcid for stomach acid) 20 mg twice a day.  You can use over-the-counter medication such as hydrocortisone cream.  If your symptoms or not improving quickly with this medication regimen I do recommend starting the prednisone taper.  While you are on this do not take any NSAIDs including aspirin, ibuprofen/Advil, naproxen/Aleve.  Follow-up closely with your primary care.  If anything worsens and you have worsening rash, swelling of your throat, shortness of breath, difficulty swallowing, difficulty speaking, fever you need to be seen immediately.     ED Prescriptions     Medication Sig Dispense Auth. Provider   predniSONE (STERAPRED UNI-PAK 21 TAB) 10 MG (21) TBPK tablet As directed 21 tablet Tanieka Pownall K, PA-C      PDMP not reviewed this encounter.   Jeani Hawking, PA-C 10/04/22 1010

## 2022-10-04 NOTE — ED Triage Notes (Signed)
Pt reports possible poison oak/ivy rash x2 days. Pt reports started on forearms and is now spread to face, neck, and eyes.

## 2022-10-04 NOTE — ED Notes (Signed)
Per PA, observe pt post IM injection approx 15 minutes for reaction. If no reaction, pt can be discharged.Pt aware.

## 2022-10-04 NOTE — Discharge Instructions (Signed)
We gave an injection of Solu-Medrol today.  I am hoping this will help with your reaction.  I do recommend over-the-counter antihistamines including cetirizine 10 mg and famotidine (branded as Pepcid for stomach acid) 20 mg twice a day.  You can use over-the-counter medication such as hydrocortisone cream.  If your symptoms or not improving quickly with this medication regimen I do recommend starting the prednisone taper.  While you are on this do not take any NSAIDs including aspirin, ibuprofen/Advil, naproxen/Aleve.  Follow-up closely with your primary care.  If anything worsens and you have worsening rash, swelling of your throat, shortness of breath, difficulty swallowing, difficulty speaking, fever you need to be seen immediately.

## 2023-08-04 ENCOUNTER — Other Ambulatory Visit: Payer: Self-pay | Admitting: Obstetrics and Gynecology

## 2023-08-04 DIAGNOSIS — Z1231 Encounter for screening mammogram for malignant neoplasm of breast: Secondary | ICD-10-CM

## 2023-08-13 ENCOUNTER — Other Ambulatory Visit: Payer: Self-pay | Admitting: Obstetrics and Gynecology

## 2023-08-13 DIAGNOSIS — N644 Mastodynia: Secondary | ICD-10-CM

## 2023-09-16 ENCOUNTER — Ambulatory Visit
Admission: RE | Admit: 2023-09-16 | Discharge: 2023-09-16 | Disposition: A | Discharge status: Home / Self Care | Payer: Self-pay | Source: Ambulatory Visit | Attending: Obstetrics and Gynecology | Admitting: Obstetrics and Gynecology

## 2023-09-16 ENCOUNTER — Ambulatory Visit: Admission: RE | Admit: 2023-09-16 | Payer: Self-pay | Source: Ambulatory Visit

## 2023-09-16 DIAGNOSIS — N644 Mastodynia: Secondary | ICD-10-CM
